# Patient Record
Sex: Female | Born: 1935 | Race: White | Hispanic: No | Marital: Married | Smoking: Never smoker
Health system: Southern US, Community
[De-identification: ages and names within clinical notes are randomized; demographics above are authoritative.]

## PROBLEM LIST (undated history)

## (undated) DIAGNOSIS — C801 Malignant (primary) neoplasm, unspecified: Secondary | ICD-10-CM

## (undated) DIAGNOSIS — I639 Cerebral infarction, unspecified: Secondary | ICD-10-CM

## (undated) DIAGNOSIS — C8590 Non-Hodgkin lymphoma, unspecified, unspecified site: Secondary | ICD-10-CM

## (undated) DIAGNOSIS — F419 Anxiety disorder, unspecified: Secondary | ICD-10-CM

## (undated) DIAGNOSIS — C569 Malignant neoplasm of unspecified ovary: Secondary | ICD-10-CM

## (undated) HISTORY — PX: APPENDECTOMY: SHX54

## (undated) HISTORY — PX: BREAST SURGERY: SHX581

## (undated) HISTORY — PX: TONSILLECTOMY: SUR1361

---

## 2009-08-25 ENCOUNTER — Encounter (INDEPENDENT_AMBULATORY_CARE_PROVIDER_SITE_OTHER): Payer: Self-pay | Admitting: Internal Medicine

## 2009-08-25 ENCOUNTER — Inpatient Hospital Stay (HOSPITAL_COMMUNITY): Admission: EM | Admit: 2009-08-25 | Discharge: 2009-08-26 | Payer: Self-pay | Admitting: Emergency Medicine

## 2009-08-25 ENCOUNTER — Ambulatory Visit: Payer: Self-pay | Admitting: Cardiology

## 2009-08-25 ENCOUNTER — Ambulatory Visit: Payer: Self-pay | Admitting: Vascular Surgery

## 2010-11-30 LAB — CBC
HCT: 38 % (ref 36.0–46.0)
Hemoglobin: 12.8 g/dL (ref 12.0–15.0)
MCHC: 33.1 g/dL (ref 30.0–36.0)
MCV: 95.5 fL (ref 78.0–100.0)
Platelets: 226 10*3/uL (ref 150–400)
RBC: 3.99 MIL/uL (ref 3.87–5.11)
WBC: 4.3 10*3/uL (ref 4.0–10.5)

## 2010-11-30 LAB — URINE CULTURE: Colony Count: 45000

## 2010-11-30 LAB — LIPID PANEL
Total CHOL/HDL Ratio: 3.6 RATIO
Triglycerides: 94 mg/dL (ref <150)
VLDL: 19 mg/dL (ref 0–40)

## 2010-11-30 LAB — HEMOCCULT GUIAC POC 1CARD (OFFICE): Fecal Occult Bld: NEGATIVE

## 2010-11-30 LAB — BASIC METABOLIC PANEL
BUN: 13 mg/dL (ref 6–23)
Calcium: 8.6 mg/dL (ref 8.4–10.5)
Calcium: 8.7 mg/dL (ref 8.4–10.5)
Chloride: 108 mEq/L (ref 96–112)
Creatinine, Ser: 0.71 mg/dL (ref 0.4–1.2)
GFR calc non Af Amer: >60 mL/min (ref >60)
GFR calc non Af Amer: >60 mL/min (ref >60)
Glucose, Bld: 105 mg/dL — ABNORMAL HIGH (ref 70–99)
Potassium: 3.6 mEq/L (ref 3.5–5.1)
Sodium: 136 mEq/L (ref 135–145)
Sodium: 138 mEq/L (ref 135–145)
Sodium: 139 mEq/L (ref 135–145)

## 2010-11-30 LAB — URINALYSIS, ROUTINE W REFLEX MICROSCOPIC
Bilirubin Urine: NEGATIVE
Ketones, ur: NEGATIVE mg/dL
Nitrite: NEGATIVE
Specific Gravity, Urine: 1.015 (ref 1.005–1.030)
Urobilinogen, UA: 0.2 mg/dL (ref 0.0–1.0)

## 2010-11-30 LAB — CARDIAC PANEL(CRET KIN+CKTOT+MB+TROPI)

## 2010-11-30 LAB — DIFFERENTIAL
Basophils Relative: 0 % (ref 0–1)
Eosinophils Absolute: 0.1 10*3/uL (ref 0.0–0.7)
Monocytes Relative: 7 % (ref 3–12)
Neutrophils Relative %: 75 % (ref 43–77)

## 2010-11-30 LAB — POCT CARDIAC MARKERS
CKMB, poc: <1 ng/mL — ABNORMAL LOW (ref 1.0–8.0)
Myoglobin, poc: 47.5 ng/mL (ref 12–200)

## 2010-11-30 LAB — CK TOTAL AND CKMB (NOT AT ARMC): CK, MB: 1.3 ng/mL (ref 0.3–4.0)

## 2010-11-30 LAB — URINE MICROSCOPIC-ADD ON

## 2014-10-18 ENCOUNTER — Ambulatory Visit
Admission: RE | Admit: 2014-10-18 | Discharge: 2014-10-18 | Disposition: A | Discharge status: Home / Self Care | Payer: Medicare Other | Source: Ambulatory Visit | Attending: Nurse Practitioner | Admitting: Nurse Practitioner

## 2014-10-18 ENCOUNTER — Other Ambulatory Visit: Payer: Self-pay | Admitting: Nurse Practitioner

## 2014-10-18 DIAGNOSIS — M542 Cervicalgia: Secondary | ICD-10-CM

## 2015-04-28 ENCOUNTER — Observation Stay (HOSPITAL_COMMUNITY)
Admission: EM | Admit: 2015-04-28 | Discharge: 2015-05-01 | Disposition: A | Discharge status: Skilled Nursing Facility | Payer: Medicare Other | Attending: Internal Medicine | Admitting: Internal Medicine

## 2015-04-28 ENCOUNTER — Encounter (HOSPITAL_COMMUNITY): Payer: Self-pay

## 2015-04-28 DIAGNOSIS — H81399 Other peripheral vertigo, unspecified ear: Secondary | ICD-10-CM | POA: Diagnosis not present

## 2015-04-28 DIAGNOSIS — Z23 Encounter for immunization: Secondary | ICD-10-CM | POA: Insufficient documentation

## 2015-04-28 DIAGNOSIS — R262 Difficulty in walking, not elsewhere classified: Secondary | ICD-10-CM | POA: Insufficient documentation

## 2015-04-28 DIAGNOSIS — R739 Hyperglycemia, unspecified: Secondary | ICD-10-CM | POA: Insufficient documentation

## 2015-04-28 DIAGNOSIS — E860 Dehydration: Secondary | ICD-10-CM | POA: Diagnosis not present

## 2015-04-28 DIAGNOSIS — E876 Hypokalemia: Secondary | ICD-10-CM

## 2015-04-28 DIAGNOSIS — Z7982 Long term (current) use of aspirin: Secondary | ICD-10-CM | POA: Diagnosis not present

## 2015-04-28 DIAGNOSIS — E785 Hyperlipidemia, unspecified: Secondary | ICD-10-CM | POA: Diagnosis not present

## 2015-04-28 DIAGNOSIS — Z8673 Personal history of transient ischemic attack (TIA), and cerebral infarction without residual deficits: Secondary | ICD-10-CM | POA: Insufficient documentation

## 2015-04-28 DIAGNOSIS — I10 Essential (primary) hypertension: Secondary | ICD-10-CM | POA: Diagnosis not present

## 2015-04-28 DIAGNOSIS — F419 Anxiety disorder, unspecified: Secondary | ICD-10-CM | POA: Insufficient documentation

## 2015-04-28 DIAGNOSIS — Z79899 Other long term (current) drug therapy: Secondary | ICD-10-CM | POA: Insufficient documentation

## 2015-04-28 DIAGNOSIS — R42 Dizziness and giddiness: Secondary | ICD-10-CM | POA: Diagnosis present

## 2015-04-28 DIAGNOSIS — H9190 Unspecified hearing loss, unspecified ear: Secondary | ICD-10-CM | POA: Diagnosis not present

## 2015-04-28 HISTORY — DX: Malignant (primary) neoplasm, unspecified: C80.1

## 2015-04-28 HISTORY — DX: Cerebral infarction, unspecified: I63.9

## 2015-04-28 HISTORY — DX: Anxiety disorder, unspecified: F41.9

## 2015-04-28 MED ORDER — SODIUM CHLORIDE 0.9 % IV SOLN
1000.0000 mL | INTRAVENOUS | Status: DC
  Administered 2015-04-29: 1000 mL via INTRAVENOUS

## 2015-04-28 MED ORDER — MECLIZINE HCL 25 MG PO TABS
25.0000 mg | ORAL_TABLET | Freq: Once | ORAL | Status: AC
  Administered 2015-04-29: 25 mg via ORAL
  Filled 2015-04-28: qty 1

## 2015-04-28 MED ORDER — ONDANSETRON HCL 4 MG/2ML IJ SOLN
4.0000 mg | Freq: Once | INTRAMUSCULAR | Status: AC
  Administered 2015-04-28: 4 mg via INTRAVENOUS
  Filled 2015-04-28: qty 2

## 2015-04-28 MED ORDER — SODIUM CHLORIDE 0.9 % IV SOLN
1000.0000 mL | Freq: Once | INTRAVENOUS | Status: AC
  Administered 2015-04-28: 1000 mL via INTRAVENOUS

## 2015-04-28 NOTE — ED Notes (Signed)
Per EMS: Pt complaining of epigastric pain and N/V. Hx of stroke, pt denies any chest pain. EMS gave 342 ASA and 4mg  Zofran. Pt very hard of hearing, states "she feels dizzy". BP 180/70, 56BPM.

## 2015-04-28 NOTE — ED Provider Notes (Signed)
CSN: [id number]     Arrival date & time 04/28/15  2337 History  This chart was scribed for Priscilla Fuel, MD by Randa Evens, ED Scribe. This patient was seen in room A01C/A01C and the patient's care was started at 11:41 PM.    Chief Complaint  Patient presents with  . Nausea  . Dizziness    Patient is a 81 y.o. female presenting with dizziness. The history is provided by the patient and the EMS personnel. No language interpreter was used.  Dizziness Associated symptoms: nausea    HPI Comments: Level 5 Caveat: Non verbal due to being hard of hearing.  Priscilla Leon is a 81 y.o. female brought in by ambulance, who presents to the Emergency Department complaining of dizziness and nausea onset PTA.    History reviewed. No pertinent past medical history. History reviewed. No pertinent past surgical history. History reviewed. No pertinent family history. Social History  Substance Use Topics  . Smoking status: Never Smoker   . Smokeless tobacco: None  . Alcohol Use: Yes   OB History    No data available     Review of Systems  Unable to perform ROS: Patient nonverbal  Gastrointestinal: Positive for nausea.  Neurological: Positive for dizziness.  All other systems reviewed and are negative.     Allergies  Review of patient's allergies indicates no known allergies.  Home Medications   Prior to Admission medications   Medication Sig Start Date End Date Taking? Authorizing Provider  ALPRAZolam (XANAX) 0.25 MG tablet Take 0.25 mg by mouth every 6 (six) hours as needed for anxiety.   Yes Historical Provider, MD  amLODipine (NORVASC) 5 MG tablet Take 5 mg by mouth daily.   Yes Historical Provider, MD  aspirin EC 81 MG tablet Take 81 mg by mouth daily.   Yes Historical Provider, MD  Cholecalciferol (VITAMIN D) 2000 UNITS tablet Take 2,000 Units by mouth daily.   Yes Historical Provider, MD  lovastatin (MEVACOR) 20 MG tablet Take 20 mg by mouth daily. 03/07/15  Yes Historical  Provider, MD  sertraline (ZOLOFT) 50 MG tablet Take 50 mg by mouth daily. 03/07/15  Yes Historical Provider, MD  vitamin B-12 (CYANOCOBALAMIN) 250 MCG tablet Take 250 mcg by mouth daily.   Yes Historical Provider, MD   BP 146/60 mmHg  Pulse 51  Resp 12  SpO2 98%   Physical Exam  Constitutional: She is oriented to person, place, and time. She appears well-developed and well-nourished. No distress.  HENT:  Head: Normocephalic and atraumatic.  Eyes: Conjunctivae and EOM are normal. Pupils are equal, round, and reactive to light.  No nystagmus   Neck: Normal range of motion. Neck supple. No JVD present.  Cardiovascular: Normal rate, regular rhythm and normal heart sounds.   No murmur heard. Pulmonary/Chest: Effort normal and breath sounds normal. She has no wheezes. She has no rales. She exhibits no tenderness.  Abdominal: Soft. She exhibits no distension and no mass. There is no tenderness.  Bowel sounds are decreased.  Musculoskeletal: Normal range of motion. She exhibits no edema.  Lymphadenopathy:    She has no cervical adenopathy.  Neurological: She is alert and oriented to person, place, and time. No cranial nerve deficit. She exhibits normal muscle tone. Coordination normal.  Symptoms reproduced by passive head movement.   Skin: Skin is warm and dry. No rash noted.  Psychiatric: She has a normal mood and affect. Her behavior is normal.  Nursing note and vitals reviewed.   ED Course  Procedures (including critical care time) DIAGNOSTIC STUDIES: Oxygen Saturation is 92% on RA, low by my interpretation.    COORDINATION OF CARE:    Labs Review Results for orders placed or performed during the hospital encounter of 43/15/40  Basic metabolic panel  Result Value Ref Range   Sodium 138 135 - 145 mmol/L   Potassium 3.0 (L) 3.5 - 5.1 mmol/L   Chloride 103 101 - 111 mmol/L   CO2 25 22 - 32 mmol/L   Glucose, Bld 156 (H) 65 - 99 mg/dL   BUN 16 6 - 20 mg/dL   Creatinine, Ser 0.75  0.44 - 1.00 mg/dL   Calcium 9.1 8.9 - 10.3 mg/dL   GFR calc non Af Amer >60 >60 mL/min   GFR calc Af Amer >60 >60 mL/min   Anion gap 10 5 - 15  CBC with Differential  Result Value Ref Range   WBC 7.0 4.0 - 10.5 K/uL   RBC 4.08 3.87 - 5.11 MIL/uL   Hemoglobin 13.0 12.0 - 15.0 g/dL   HCT 39.1 36.0 - 46.0 %   MCV 95.8 78.0 - 100.0 fL   MCH 31.9 26.0 - 34.0 pg   MCHC 33.2 30.0 - 36.0 g/dL   RDW 13.0 11.5 - 15.5 %   Platelets 186 150 - 400 K/uL   Neutrophils Relative % 63 43 - 77 %   Neutro Abs 4.4 1.7 - 7.7 K/uL   Lymphocytes Relative 26 12 - 46 %   Lymphs Abs 1.8 0.7 - 4.0 K/uL   Monocytes Relative 10 3 - 12 %   Monocytes Absolute 0.7 0.1 - 1.0 K/uL   Eosinophils Relative 1 0 - 5 %   Eosinophils Absolute 0.1 0.0 - 0.7 K/uL   Basophils Relative 0 0 - 1 %   Basophils Absolute 0.0 0.0 - 0.1 K/uL  Troponin I  Result Value Ref Range   Troponin I <0.03 <0.031 ng/mL   Imaging Review Mr Brain Wo Contrast  04/29/2015   CLINICAL DATA:  89 old female with history of stroke. Episodes of dizziness worse since yesterday. Initial encounter.  EXAM: MRI HEAD WITHOUT CONTRAST  TECHNIQUE: Multiplanar, multiecho pulse sequences of the brain and surrounding structures were obtained without intravenous contrast.  COMPARISON:  08/25/2009 head CT.  No comparison brain MR.  FINDINGS: No acute infarct.  Remote posterior left temporal - occipital lobe infarct with encephalomalacia and laminar necrosis/remote blood breakdown products.  Global atrophy without hydrocephalus.  No intracranial mass lesion noted on this unenhanced exam.  Major intracranial vascular structures are patent and ectatic.  Elongated asymmetric left globe.  Post right lens replacement.  Right C2-3 facet joint degenerative changes. Mild transverse ligament prominence. Cervical medullary junction unremarkable.  Pituitary and pineal region within the range normal limits.  Minimal polypoid opacification inferior left maxillary sinus.   Prominent choroid cysts incidentally noted in without change from prior CT.  IMPRESSION: No acute infarct.  Remote posterior left temporal - occipital lobe infarct.  Global atrophy without hydrocephalus.   Electronically Signed   By: Genia Del M.D.   On: 04/29/2015 07:22   I have personally reviewed and evaluated these images and lab results as part of my medical decision-making.   EKG Interpretation   Date/Time:  Monday April 28 2015 23:55:15 EDT Ventricular Rate:  56 PR Interval:  226 QRS Duration: 83 QT Interval:  528 QTC Calculation: 510 R Axis:   8 Text Interpretation:  Atrial-paced complexes Prolonged PR interval  Abnormal R-wave  progression, early transition Abnormal T, consider  ischemia, anterior leads Prolonged QT interval When compared with ECG of  08/25/2009, No significant change was found Confirmed by [facility]  MD, Shakaya Bhullar  ([id number]) on 04/28/2015 11:59:46 PM      MDM   Final diagnoses:  Peripheral vertigo, unspecified laterality  Hypokalemia    Dizziness and nausea worsened with head movement consistent with peripheral vertigo. She'll be given IV fluids, ondansetron, and oral meclizine and reassessed.    She felt better after above treatment, but when she sat up, dizziness and nausea recurred. She is hypokalemic, so was given IV and oral potassium and was given metoclopramide.  Following this, she again stated she felt better, but had recurrence of severe vertigo with sitting up. She is given a dose of lorazepam and sent for an MRI to rule out stroke. MRI shows no evidence of stroke and she again stated that she felt better. However, she still has recurrence of severe vertigo with as sitting up. She does not have adequate resources at home to manage this. I feel she will need to be admitted for short stay until her vertigo can be adequately controlled.   I personally performed the services described in this documentation, which was scribed in my presence. The recorded  information has been reviewed and is accurate.    Priscilla Fuel, MD 85/27/78 2423

## 2015-04-29 ENCOUNTER — Emergency Department (HOSPITAL_COMMUNITY): Payer: Medicare Other

## 2015-04-29 ENCOUNTER — Encounter (HOSPITAL_COMMUNITY): Payer: Self-pay | Admitting: Emergency Medicine

## 2015-04-29 DIAGNOSIS — F419 Anxiety disorder, unspecified: Secondary | ICD-10-CM | POA: Insufficient documentation

## 2015-04-29 DIAGNOSIS — E876 Hypokalemia: Secondary | ICD-10-CM | POA: Diagnosis present

## 2015-04-29 DIAGNOSIS — R42 Dizziness and giddiness: Secondary | ICD-10-CM

## 2015-04-29 DIAGNOSIS — E860 Dehydration: Secondary | ICD-10-CM

## 2015-04-29 DIAGNOSIS — H9190 Unspecified hearing loss, unspecified ear: Secondary | ICD-10-CM | POA: Diagnosis not present

## 2015-04-29 DIAGNOSIS — I10 Essential (primary) hypertension: Secondary | ICD-10-CM | POA: Diagnosis not present

## 2015-04-29 DIAGNOSIS — H81399 Other peripheral vertigo, unspecified ear: Secondary | ICD-10-CM | POA: Diagnosis present

## 2015-04-29 DIAGNOSIS — R739 Hyperglycemia, unspecified: Secondary | ICD-10-CM | POA: Diagnosis present

## 2015-04-29 LAB — CBC WITH DIFFERENTIAL/PLATELET
BASOS ABS: 0 10*3/uL (ref 0.0–0.1)
BASOS PCT: 0 % (ref 0–1)
Eosinophils Absolute: 0.1 10*3/uL (ref 0.0–0.7)
Eosinophils Relative: 1 % (ref 0–5)
HEMATOCRIT: 39.1 % (ref 36.0–46.0)
HEMOGLOBIN: 13 g/dL (ref 12.0–15.0)
LYMPHS PCT: 26 % (ref 12–46)
Lymphs Abs: 1.8 10*3/uL (ref 0.7–4.0)
MCH: 31.9 pg (ref 26.0–34.0)
MCHC: 33.2 g/dL (ref 30.0–36.0)
MCV: 95.8 fL (ref 78.0–100.0)
MONO ABS: 0.7 10*3/uL (ref 0.1–1.0)
Monocytes Relative: 10 % (ref 3–12)
NEUTROS ABS: 4.4 10*3/uL (ref 1.7–7.7)
NEUTROS PCT: 63 % (ref 43–77)
Platelets: 186 10*3/uL (ref 150–400)
RBC: 4.08 MIL/uL (ref 3.87–5.11)
RDW: 13 % (ref 11.5–15.5)
WBC: 7 10*3/uL (ref 4.0–10.5)

## 2015-04-29 LAB — BASIC METABOLIC PANEL
ANION GAP: 10 (ref 5–15)
BUN: 16 mg/dL (ref 6–20)
CALCIUM: 9.1 mg/dL (ref 8.9–10.3)
CO2: 25 mmol/L (ref 22–32)
Chloride: 103 mmol/L (ref 101–111)
Creatinine, Ser: 0.75 mg/dL (ref 0.44–1.00)
GLUCOSE: 156 mg/dL — AB (ref 65–99)
POTASSIUM: 3 mmol/L — AB (ref 3.5–5.1)
Sodium: 138 mmol/L (ref 135–145)

## 2015-04-29 LAB — URINE MICROSCOPIC-ADD ON

## 2015-04-29 LAB — TROPONIN I: Troponin I: <0.03 ng/mL (ref <0.031)

## 2015-04-29 LAB — URINALYSIS, ROUTINE W REFLEX MICROSCOPIC
Bilirubin Urine: NEGATIVE
Glucose, UA: NEGATIVE mg/dL
Ketones, ur: NEGATIVE mg/dL
LEUKOCYTES UA: NEGATIVE
NITRITE: NEGATIVE
PROTEIN: NEGATIVE mg/dL
SPECIFIC GRAVITY, URINE: 1.006 (ref 1.005–1.030)
UROBILINOGEN UA: 0.2 mg/dL (ref 0.0–1.0)
pH: 7 (ref 5.0–8.0)

## 2015-04-29 LAB — MRSA PCR SCREENING: MRSA BY PCR: NEGATIVE

## 2015-04-29 LAB — MAGNESIUM: MAGNESIUM: 1.7 mg/dL (ref 1.7–2.4)

## 2015-04-29 LAB — TSH: TSH: 2.517 u[IU]/mL (ref 0.350–4.500)

## 2015-04-29 LAB — VITAMIN B12: Vitamin B-12: 1332 pg/mL — ABNORMAL HIGH (ref 180–914)

## 2015-04-29 MED ORDER — POTASSIUM CHLORIDE 10 MEQ/100ML IV SOLN
10.0000 meq | Freq: Once | INTRAVENOUS | Status: AC
  Administered 2015-04-29: 10 meq via INTRAVENOUS
  Filled 2015-04-29: qty 100

## 2015-04-29 MED ORDER — SENNOSIDES-DOCUSATE SODIUM 8.6-50 MG PO TABS: 1.0000 | ORAL_TABLET | Freq: Every evening | ORAL | Status: DC | PRN

## 2015-04-29 MED ORDER — LORAZEPAM 2 MG/ML IJ SOLN
0.5000 mg | Freq: Once | INTRAMUSCULAR | Status: AC
  Administered 2015-04-29: 0.5 mg via INTRAVENOUS
  Filled 2015-04-29: qty 1

## 2015-04-29 MED ORDER — ACETAMINOPHEN 325 MG PO TABS: 650.0000 mg | ORAL_TABLET | Freq: Four times a day (QID) | ORAL | Status: DC | PRN

## 2015-04-29 MED ORDER — VITAMIN D 50 MCG (2000 UT) PO TABS: 2000.0000 [IU] | ORAL_TABLET | Freq: Every day | ORAL | Status: DC

## 2015-04-29 MED ORDER — SERTRALINE HCL 50 MG PO TABS
50.0000 mg | ORAL_TABLET | Freq: Every day | ORAL | Status: DC
  Administered 2015-04-29 – 2015-05-01 (×3): 50 mg via ORAL
  Filled 2015-04-29 (×3): qty 1

## 2015-04-29 MED ORDER — ALUM & MAG HYDROXIDE-SIMETH 200-200-20 MG/5ML PO SUSP: 30.0000 mL | Freq: Four times a day (QID) | ORAL | Status: DC | PRN

## 2015-04-29 MED ORDER — SODIUM CHLORIDE 0.9 % IV SOLN
INTRAVENOUS | Status: AC
  Administered 2015-04-29: 11:00:00 via INTRAVENOUS

## 2015-04-29 MED ORDER — ENOXAPARIN SODIUM 40 MG/0.4ML ~~LOC~~ SOLN
40.0000 mg | SUBCUTANEOUS | Status: DC
  Administered 2015-04-29: 40 mg via SUBCUTANEOUS
  Filled 2015-04-29: qty 0.4

## 2015-04-29 MED ORDER — POTASSIUM CHLORIDE CRYS ER 20 MEQ PO TBCR
20.0000 meq | EXTENDED_RELEASE_TABLET | Freq: Once | ORAL | Status: AC
  Administered 2015-04-29: 20 meq via ORAL
  Filled 2015-04-29: qty 1

## 2015-04-29 MED ORDER — VITAMIN B-12 250 MCG PO TABS: 250.0000 ug | ORAL_TABLET | Freq: Every day | ORAL | Status: DC

## 2015-04-29 MED ORDER — POTASSIUM CHLORIDE CRYS ER 20 MEQ PO TBCR
40.0000 meq | EXTENDED_RELEASE_TABLET | Freq: Once | ORAL | Status: AC
  Administered 2015-04-29: 40 meq via ORAL
  Filled 2015-04-29: qty 2

## 2015-04-29 MED ORDER — ACETAMINOPHEN 650 MG RE SUPP: 650.0000 mg | Freq: Four times a day (QID) | RECTAL | Status: DC | PRN

## 2015-04-29 MED ORDER — VITAMIN D 1000 UNITS PO TABS
2000.0000 [IU] | ORAL_TABLET | Freq: Every day | ORAL | Status: DC
  Administered 2015-04-29 – 2015-05-01 (×3): 2000 [IU] via ORAL
  Filled 2015-04-29 (×3): qty 2

## 2015-04-29 MED ORDER — CYANOCOBALAMIN 250 MCG PO TABS
250.0000 ug | ORAL_TABLET | Freq: Every day | ORAL | Status: DC
  Administered 2015-04-29 – 2015-05-01 (×3): 250 ug via ORAL
  Filled 2015-04-29 (×3): qty 1

## 2015-04-29 MED ORDER — AMLODIPINE BESYLATE 5 MG PO TABS
5.0000 mg | ORAL_TABLET | Freq: Every day | ORAL | Status: DC
  Administered 2015-04-29 – 2015-05-01 (×3): 5 mg via ORAL
  Filled 2015-04-29 (×3): qty 1

## 2015-04-29 MED ORDER — INFLUENZA VAC SPLIT QUAD 0.5 ML IM SUSY
0.5000 mL | PREFILLED_SYRINGE | INTRAMUSCULAR | Status: AC
  Administered 2015-05-01: 0.5 mL via INTRAMUSCULAR
  Filled 2015-04-29: qty 0.5

## 2015-04-29 MED ORDER — PRAVASTATIN SODIUM 20 MG PO TABS
20.0000 mg | ORAL_TABLET | Freq: Every day | ORAL | Status: DC
  Administered 2015-04-29 – 2015-04-30 (×2): 20 mg via ORAL
  Filled 2015-04-29 (×2): qty 1

## 2015-04-29 MED ORDER — MECLIZINE HCL 12.5 MG PO TABS
12.5000 mg | ORAL_TABLET | Freq: Three times a day (TID) | ORAL | Status: DC
  Administered 2015-04-29 – 2015-05-01 (×7): 12.5 mg via ORAL
  Filled 2015-04-29 (×10): qty 1

## 2015-04-29 MED ORDER — METOCLOPRAMIDE HCL 5 MG/ML IJ SOLN
10.0000 mg | Freq: Once | INTRAMUSCULAR | Status: AC
  Administered 2015-04-29: 10 mg via INTRAVENOUS
  Filled 2015-04-29: qty 2

## 2015-04-29 MED ORDER — ASPIRIN EC 81 MG PO TBEC
81.0000 mg | DELAYED_RELEASE_TABLET | Freq: Every day | ORAL | Status: DC
Start: 2015-04-29 — End: 2015-05-01
  Administered 2015-04-29 – 2015-05-01 (×3): 81 mg via ORAL
  Filled 2015-04-29 (×4): qty 1

## 2015-04-29 NOTE — ED Notes (Signed)
Patient just used bedpan approximately 30 minutes ago.

## 2015-04-29 NOTE — Care Management Note (Signed)
Case Management Note  Patient Details  Name: Sian Rockers MRN: [id number] Date of Birth: 06-22-1924  Subjective/Objective:     Patient lives in [facility] facility, [place], she says her family lives there as well. She has [employer] and she has a pcp.  She has transportation at [employer] and has no problem getting medications.  Await pt eval.  NCM will cont to follow for dc needs.               Action/Plan:   Expected Discharge Date:                  Expected Discharge Plan:  [place]  In-House Referral:     Discharge planning Services  CM Consult  Post Acute Care Choice:    Choice offered to:     DME Arranged:    DME Agency:     HH Arranged:    [place] Agency:     Status of Service:  In process, will continue to follow  Medicare Important Message Given:    Date Medicare IM Given:    Medicare IM give by:    Date Additional Medicare IM Given:    Additional Medicare Important Message give by:     If discussed at [place] of Stay Meetings, dates discussed:    Additional Comments:  Zenon Mayo, RN 04/29/2015, 2:18 PM

## 2015-04-29 NOTE — ED Notes (Signed)
Called floor to give report, they advised would have to take my number and call me back in 15 minutes.

## 2015-04-29 NOTE — Progress Notes (Signed)
NURSING PROGRESS NOTE  Priscilla Leon [id number] Admission Data: 04/29/2015 9:39 AM Attending Provider: Barton Dubois, MD [id number], Herschell Dimes, MD Code Status: Full  Allergies:  Review of patient's allergies indicates no known allergies. Past Medical History:   has a past medical history of Stroke. Past Surgical History:   has no past surgical history on file. Social History:   reports that she has never smoked. She does not have any smokeless tobacco history on file. She reports that she drinks alcohol.  Priscilla Leon is a 81 y.o. female patient admitted from ED:   Last Documented Vital Signs: Blood pressure 142/53, pulse 60, resp. rate 18, SpO2 96 %.  Cardiac Monitoring:  None ordered.  IV Fluids:  IV in place, occlusive dsg intact without redness, IV cath antecubital left, condition patent and no redness normal saline.   Skin: WDL  Patient/Family orientated to room. Information packet given to patient/family. Admission inpatient armband information verified with patient/family to include name and date of birth and placed on patient arm. Side rails up x 2, fall assessment and education completed with patient/family. Patient/family able to verbalize understanding of risk associated with falls and verbalized understanding to call for assistance before getting out of bed. Call light within reach. Patient/family able to voice and demonstrate understanding of unit orientation instructions.    Will continue to evaluate and treat per MD orders.   Hendricks Limes RN, BS, BSN

## 2015-04-29 NOTE — ED Notes (Signed)
Patient transported to MRI with family

## 2015-04-29 NOTE — ED Notes (Signed)
Patient had bowel movement, unable to get urine.

## 2015-04-29 NOTE — Evaluation (Signed)
Physical Therapy Evaluation Patient Details Name: Priscilla Leon MRN: [id number] DOB: [birth year] Today's Date: 04/29/2015   History of Present Illness  81 y.o. female admitted for peripheral vertigo, and hypokalemia.  Clinical Impression  Pt admitted with the above diagnosis. Pt currently with functional limitations due to the deficits listed below (see PT Problem List). Positive for right horizontal cupulolithiasis and right posterior canalithiasis when assessed with horizontal roll test and Dix-Hallpike test. Treated with Modified CRT and Epley's Maneuver. These procedures typically need to be performed 2-3 times in cases with high intensity symptoms, which Priscilla Leon presents with today. Will follow up tomorrow for further assessment and progression.    Follow Up Recommendations Home health PT;Supervision/Assistance - 24 hour (Vestibular rehab)    Equipment Recommendations  Vestibular Assessment    04/29/15 1721  Vestibular Assessment  General Observation No apparent distress while supine in bed. Very apprehensive upon sitting up with significant loss of balance and need for external support to remain upright when symptoms elicited.  Symptom Behavior  Type of Dizziness Spinning  Duration of Dizziness Delayed onset up to 1-2 minutes after sitting. High intensity symptoms last approx 2 minutes with residual symptoms of spinning occuring >2 minutes  Aggravating Factors Supine to sit  Relieving Factors Lying supine  Occulomotor Exam  Occulomotor Alignment Normal  Spontaneous Right beating nystagmus  Smooth Pursuits Saccades  Vestibulo-Occular Reflex  Comment Not assessed due to intensity of symptoms at time of evaluation  Auditory  Comments Denies any changes in hearing, HOH at baseline  Positional Testing  Dix-Hallpike Dix-Hallpike Right;Dix-Hallpike Left  Horizontal Canal Testing Horizontal Canal Right;Horizontal Canal Left  Dix-Hallpike Right  Dix-Hallpike Right Duration  1.5 min  Dix-Hallpike Right Symptoms Upbeat, right rotatory nystagmus  Dix-Hallpike Left  Dix-Hallpike Left Duration 1.5 minutes  Dix-Hallpike Left Symptoms Upbeat, right rotatory nystagmus  Horizontal Canal Right  Horizontal Canal Right Duration 2 minutes  Horizontal Canal Right Symptoms Geotrophic;Nystagmus (less intense with head turned right)  Horizontal Canal Left  Horizontal Canal Left Duration 2 minutes  Horizontal Canal Left Symptoms Ageotrophic;Nystagmus (More intense with head turned left)  Positional Sensitivities  Sit to Supine 0  Supine to Right Side 2  Supine to Sitting 4  Right Hallpike 0  Up from Right Hallpike 4  Up from Left Hallpike 4  Rolling Right 2  Rolling Left 0  Positional Sensitivities Comments Minimal symptoms reported with rt head turn in 30 degrees of flexion, no spinning reported with Lt head turn in 30 degrees of cervical flexion    None recommended by PT      Recommendations for Other Services       Precautions / Restrictions Precautions Precautions: Fall Restrictions Weight Bearing Restrictions: No      Mobility  Bed Mobility Overal bed mobility: Needs Assistance Bed Mobility: Rolling;Sidelying to Sit;Sit to Sidelying Rolling: Modified independent (Device/Increase time) Sidelying to sit: Modified independent (Device/Increase time)     Sit to sidelying: Modified independent (Device/Increase time) General bed mobility comments: Requires extra time.  Transfers                 General transfer comment: unable to tolerate at this time due to dizziness.  Ambulation/Gait                Stairs            Wheelchair Mobility    Modified Rankin (Stroke Patients Only)       Balance Overall balance assessment: Needs assistance Sitting-balance support: No  upper extremity supported Sitting balance-Leahy Scale: Fair Sitting balance - Comments: Tolerates sitting EOB approx 1 minute unsupported before symptoms  cause pt to lose balance and reach for bed for stability. This was assessed several times.                                     Pertinent Vitals/Pain Pain Assessment: No/denies pain    Home Living Family/patient expects to be discharged to:: Private residence Living Arrangements: Spouse/significant other Available Help at Discharge: Family;Available 24 hours/day Type of Home: Independent living facility       Home Layout: One level Home Equipment: [place] - 2 wheels;Cane - single point Additional Comments: Daughter reports 24/7 supervision can be arranged for a short period of time.    Prior Function Level of Independence: Independent with assistive device(s)         Comments: Uses cane for mobility. Assists husband with ADLs     Hand Dominance        Extremity/Trunk Assessment   Upper Extremity Assessment: Defer to OT evaluation           Lower Extremity Assessment: Overall WFL for tasks assessed         Communication   Communication: No difficulties  Cognition Arousal/Alertness: Awake/alert Behavior During Therapy: WFL for tasks assessed/performed Overall Cognitive Status: History of cognitive impairments - at baseline                      General Comments General comments (skin integrity, edema, etc.): See vestibular assessment attached to note    Exercises        Assessment/Plan    PT Assessment Patient needs continued PT services  PT Diagnosis Difficulty walking;Other (comment) (Dizziness)   PT Problem List Decreased activity tolerance;Decreased balance;Decreased mobility  PT Treatment Interventions DME instruction;Gait training;Functional mobility training;Therapeutic exercise;Therapeutic activities;Balance training;Patient/family education   PT Goals (Current goals can be found in the Care Plan section) Acute Rehab PT Goals Patient Stated Goal: Feel better PT Goal Formulation: With patient Time For Goal Achievement:  05/13/15 Potential to Achieve Goals: Good    Frequency Min 3X/week   Barriers to discharge Decreased caregiver support Can arrange 24/7 assist if needed, for a short period of time    Co-evaluation               End of Session   Activity Tolerance: Other (comment) (Limited by dizziness) Patient left: in bed;with call bell/phone within reach;with bed alarm set;with family/visitor present Nurse Communication: Mobility status    Functional Assessment Tool Used: Clinical Observation Functional Limitation: Mobility: Walking and moving around Mobility: Walking and Moving Around Current Status (Y1017): At least 60 percent but less than 80 percent impaired, limited or restricted Mobility: Walking and Moving Around Goal Status [phone]): At least 1 percent but less than 20 percent impaired, limited or restricted    Time: 1559-1650 PT Time Calculation (min) (ACUTE ONLY): 51 min   Charges:   PT Evaluation $Initial PT Evaluation Tier I: 1 Procedure PT Treatments $Therapeutic Activity: 8-22 mins $Canalith Rep Proc: 8-22 mins   PT G Codes:   PT G-Codes **NOT FOR INPATIENT CLASS** Functional Assessment Tool Used: Clinical Observation Functional Limitation: Mobility: Walking and moving around Mobility: Walking and Moving Around Current Status (E5277): At least 60 percent but less than 80 percent impaired, limited or restricted Mobility: Walking and Moving Around Goal Status [phone]): At  least 1 percent but less than 20 percent impaired, limited or restricted       Priscilla Leon 04/29/2015, 5:31 PM Camille Bal [place], [place]

## 2015-04-29 NOTE — H&P (Signed)
[facility] History and Physical                                                                                    Priscilla Leon, is a 81 y.o. female  MRN: [id number]   DOB - Feb 16, 1924  Admit Date - 04/28/2015  Outpatient Primary MD for the patient is Shamleffer, Herschell Dimes, MD  Referring Physician:    Chief Complaint:   Chief Complaint  Patient presents with  . Nausea  . Dizziness     HPI  Priscilla Leon  is a 81 y.o. female, with hypertension, history of anemia and previous stroke. She moved to an independent living facility in [place] from [place] in November 2015. Per her daughter, the patient was feeling well over the weekend but yesterday acutely developed symptoms of dizziness. The room spins around her. She is shaking and complains of headache. She is unable to stand. The patient reports that for the past week she's been feeling badly, she hasn't noticed any particular pain, but she describes decreased energy. Both the patient and her daughter indicate a high level of stress at home. Both patient and her husband are developing dementia. Reportedly the patient attends to all of her husband's ADLs and recently this has become too much for her.  Yesterday the patient took one alprazolam 0.25 that are prescribed PRN.  The daughter mentions that previously when her mother took Xanax on a more regular basis she had symptoms of dizziness and hallucinations.  Further she mentions that the patient's SSRI was changed to Zoloft at some point recently.  The patient and her husband are very active.  [facility] [employer] [facility]  In the ER, MR Brain is negative for acute stroke.  Labs indicate hypokalemia and mild hyperglycemia (150).   Review of Systems   In addition to the HPI above,  No Fever-chills, + Headache No problems swallowing food or Liquids, No Chest pain, Cough or Shortness of Breath, No Abdominal pain, No Nausea or Vomiting, Bowel  movements are regular, No Blood in stool or Urine, No dysuria, No new skin rashes or bruises, No new joints pains-aches,  No new weakness, tingling, numbness in any extremity, No recent weight gain or loss, A full 10 point Review of Systems was done, except as stated above, all other Review of Systems were negative.  Past Medical History  Past Medical History  Diagnosis Date  . Stroke     History reviewed. No pertinent past surgical history.    Social History Social History  Substance Use Topics  . Smoking status: Never Smoker   . Smokeless tobacco: Not on file  . Alcohol Use: Yes   lives in independent living with her husband. Independent with ADLs. Reportedly very [facility] and [facility] [employer] [facility]  Family History Her mother died at age 40 with cardiac issues, her 3 brothers also passed in their 53s with cardiac issues  Prior to Admission medications   Medication Sig Start Date End Date Taking? Authorizing Provider  ALPRAZolam (XANAX) 0.25 MG tablet Take 0.25 mg by mouth every 6 (six) hours as needed for anxiety.  Yes Historical Provider, MD  amLODipine (NORVASC) 5 MG tablet Take 5 mg by mouth daily.   Yes Historical Provider, MD  aspirin EC 81 MG tablet Take 81 mg by mouth daily.   Yes Historical Provider, MD  Cholecalciferol (VITAMIN D) 2000 UNITS tablet Take 2,000 Units by mouth daily.   Yes Historical Provider, MD  lovastatin (MEVACOR) 20 MG tablet Take 20 mg by mouth daily. 03/07/15  Yes Historical Provider, MD  sertraline (ZOLOFT) 50 MG tablet Take 50 mg by mouth daily. 03/07/15  Yes Historical Provider, MD  vitamin B-12 (CYANOCOBALAMIN) 250 MCG tablet Take 250 mcg by mouth daily.   Yes Historical Provider, MD    No Known Allergies  Physical Exam  Vitals  Blood pressure 142/53, pulse 60, resp. rate 18, SpO2 96 %.   General: Thin, frail, pleasant female lying in bed in NAD, heart of hearing, blind in left eye  Psych:  Normal affect and insight, Not Suicidal  or Homicidal, Awake Alert, Oriented X 3.  Neuro:   Positive horizontal nystagmus, 5/5 strength symmetric, sensitivity is symmetric, Patient's body wheels to the left when she attempts to sit up slowly.  ENT:  Blind in left eye.  Ears and Eyes appear Normal, Conjunctivae clear, PER. Dry oral mucosa, no erythema or exudates  Neck:  Supple, No lymphadenopathy appreciated  Respiratory:  Apneic at  times with respiratory rates between 9 and 11. Symmetrical chest wall movement, Good air movement bilaterally, CTAB.  Cardiac:  Slightly bradycardic, No Murmurs, no LE edema noted, no JVD.    Abdomen:  Thin, Positive bowel sounds, Soft, Non tender, Non distended,  No masses appreciated  Skin:  No Cyanosis, decreased Skin Turgor, No Skin Rash or Bruise.  Extremities:  Able to move all 4. 5/5 strength in each,  no effusions.  Data Review  CBC  Recent Labs Lab 04/29/15 0014  WBC 7.0  HGB 13.0  HCT 39.1  PLT 186  MCV 95.8  MCH 31.9  MCHC 33.2  RDW 13.0  LYMPHSABS 1.8  MONOABS 0.7  EOSABS 0.1  BASOSABS 0.0    Chemistries   Recent Labs Lab 04/29/15 0014  NA 138  K 3.0*  CL 103  CO2 25  GLUCOSE 156*  BUN 16  CREATININE 0.75  CALCIUM 9.1    Cardiac Enzymes  Recent Labs Lab 04/29/15 0014  TROPONINI <0.03     Urinalysis: Pending  Imaging results:   Mr Brain Wo Contrast  04/29/2015   CLINICAL DATA:  46 old female with history of stroke. Episodes of dizziness worse since yesterday. Initial encounter.  EXAM: MRI HEAD WITHOUT CONTRAST  TECHNIQUE: Multiplanar, multiecho pulse sequences of the brain and surrounding structures were obtained without intravenous contrast.  COMPARISON:  08/25/2009 head CT.  No comparison brain MR.  FINDINGS: No acute infarct.  Remote posterior left temporal - occipital lobe infarct with encephalomalacia and laminar necrosis/remote blood breakdown products.  Global atrophy without hydrocephalus.  No intracranial mass lesion noted on this  unenhanced exam.  Major intracranial vascular structures are patent and ectatic.  Elongated asymmetric left globe.  Post right lens replacement.  Right C2-3 facet joint degenerative changes. Mild transverse ligament prominence. Cervical medullary junction unremarkable.  Pituitary and pineal region within the range normal limits.  Minimal polypoid opacification inferior left maxillary sinus.  Prominent choroid cysts incidentally noted in without change from prior CT.  IMPRESSION: No acute infarct.  Remote posterior left temporal - occipital lobe infarct.  Global atrophy without hydrocephalus.  Electronically Signed   By: Genia Del M.D.   On: 04/29/2015 07:22    My personal review of EKG: Repeating EKG. Prolonged QT. No significant change from 2010 EKG. Pacer spikes due to artifact.   Assessment & Plan  Principal Problem:   Peripheral vertigo Active Problems:   Hypokalemia   HTN (hypertension)   Hyperglycemia   Dehydration   Peripheral vertigo MRI negative for acute stroke.  Patient has had a previous stroke with no residual deficits. Possibly multifactorial-consider medications (alprazolam, Zoloft), dehydration, urinary analysis pending Admit to med surg.  Scheduled TID meclizine.  Vestibular physical therapy evaluation. Check TSH, Serum B12.  Hypokalemia Magnesium level pending. Received 50 mEq of potassium in the ER. Will continue to replete in gentle IV fluids.  Hypertension Continue amlodipine as she takes at home. Will check orthostatics and make further recommendations after reviewing these results.  Hyperglycemia Check hemoglobin A1c. No previous history of diabetes mellitus  Dehydration Based on clinical exam. We'll give gentle IV fluids for a limited period.    Consultants Called:  none  Family Communication:   Her bedside  Code Status:  full  Condition:  Stable, but guarded.  Potential Disposition:   Time spent in minutes : Lynn,  PA-C on  04/29/2015 at 8:58 AM Between 7am to 7pm - Pager - [phone] After 7pm go to [email or web address] - password [email or web address] And look for the night coverage person covering me after hours  [facility]

## 2015-04-29 NOTE — Progress Notes (Signed)
MD [facility] paged results of new EKG taken at 0958.

## 2015-04-29 NOTE — ED Notes (Signed)
Patient didn't tolerate ambulation.  Patient states she was "too dizzy to get up".

## 2015-04-29 NOTE — ED Notes (Signed)
Hospitalist at bedside to speak with patient and family.

## 2015-04-29 NOTE — ED Notes (Signed)
Hospitalists still at bedside.

## 2015-04-30 DIAGNOSIS — H81399 Other peripheral vertigo, unspecified ear: Secondary | ICD-10-CM | POA: Diagnosis not present

## 2015-04-30 DIAGNOSIS — I10 Essential (primary) hypertension: Secondary | ICD-10-CM

## 2015-04-30 DIAGNOSIS — E876 Hypokalemia: Secondary | ICD-10-CM | POA: Diagnosis not present

## 2015-04-30 DIAGNOSIS — R739 Hyperglycemia, unspecified: Secondary | ICD-10-CM | POA: Diagnosis not present

## 2015-04-30 LAB — HEMOGLOBIN A1C
Hgb A1c MFr Bld: 5.8 % — ABNORMAL HIGH (ref 4.8–5.6)
MEAN PLASMA GLUCOSE: 120 mg/dL

## 2015-04-30 LAB — BASIC METABOLIC PANEL
ANION GAP: 7 (ref 5–15)
BUN: 7 mg/dL (ref 6–20)
CHLORIDE: 103 mmol/L (ref 101–111)
CO2: 28 mmol/L (ref 22–32)
Calcium: 8.5 mg/dL — ABNORMAL LOW (ref 8.9–10.3)
Creatinine, Ser: 0.55 mg/dL (ref 0.44–1.00)
Glucose, Bld: 92 mg/dL (ref 65–99)
POTASSIUM: 3.3 mmol/L — AB (ref 3.5–5.1)
SODIUM: 138 mmol/L (ref 135–145)

## 2015-04-30 LAB — URINE CULTURE

## 2015-04-30 MED ORDER — ENOXAPARIN SODIUM 30 MG/0.3ML ~~LOC~~ SOLN
30.0000 mg | SUBCUTANEOUS | Status: DC
  Administered 2015-04-30 – 2015-05-01 (×2): 30 mg via SUBCUTANEOUS
  Filled 2015-04-30 (×2): qty 0.3

## 2015-04-30 MED ORDER — MAGNESIUM SULFATE 2 GM/50ML IV SOLN
2.0000 g | Freq: Once | INTRAVENOUS | Status: AC
  Administered 2015-04-30: 2 g via INTRAVENOUS
  Filled 2015-04-30: qty 50

## 2015-04-30 MED ORDER — POTASSIUM CHLORIDE CRYS ER 20 MEQ PO TBCR
40.0000 meq | EXTENDED_RELEASE_TABLET | Freq: Once | ORAL | Status: AC
  Administered 2015-04-30: 40 meq via ORAL
  Filled 2015-04-30: qty 2

## 2015-04-30 MED ORDER — ALPRAZOLAM 0.25 MG PO TABS
0.2500 mg | ORAL_TABLET | Freq: Three times a day (TID) | ORAL | Status: DC | PRN
  Administered 2015-04-30 – 2015-05-01 (×2): 0.25 mg via ORAL
  Filled 2015-04-30 (×3): qty 1

## 2015-04-30 NOTE — Progress Notes (Signed)
Physical Therapy Treatment Patient Details Name: Priscilla Leon MRN: [id number] DOB: August 10, 1924 Today's Date: 04/30/2015    History of Present Illness 81 y.o. female admitted for peripheral vertigo, and hypokalemia.    PT Comments    Further vestibular assessment completed today indicating a peripheral left hypofunction (unknown cause- as pt doesn't respond yes to any of the history questions).  Son reports that she has a "vertigo episode" ~3 years ago that took a few weeks to go away and she was in the hospital during that episode as well (with no know source or diagnosis that he knows of).  She continues to be significantly unsteady on her feet and requires two person assist to just transfer to the [facility] safely.  She, at this point, is not safe to return home to an independent living apartment and will need post acute rehab.  SNF recommended at this time.    Follow Up Recommendations  SNF     Equipment Recommendations  Wheelchair (measurements PT);Wheelchair cushion (measurements PT)    Recommendations for Other Services   NA     Precautions / Restrictions Precautions Precautions: Fall Precaution Comments: very high fall risk     Mobility  Bed Mobility Overal bed mobility: Needs Assistance Bed Mobility: Rolling;Sidelying to Sit;Supine to Sit;Sit to Supine Rolling: Supervision Sidelying to sit: Min assist Supine to sit: Min assist Sit to supine: Min assist   General bed mobility comments: Min assist to support trunk when going to sitting as this is symptomatic for patient, assist level immediately increases when sitting EOB.   Transfers Overall transfer level: Needs assistance Equipment used: 2 person hand held assist Transfers: Sit to/from [facility] Sit to Stand: +2 physical assistance;Mod assist Stand pivot transfers: +2 physical assistance;Mod assist       General transfer comment: Two person mod assist to stand and get onto and off of bedside  commode.  Encouraged pt to move slowly and use targets to try to steady her "shakes".  Pt's automatic inclination is to close her eyes when she feels "the shakes" and I instead encouraged her to focus on my nose to stabilize her gaze and boughts of vertigo resolved more quickly with this strategy.   Ambulation/Gait             General Gait Details: unable to attempt ambulation.        Balance Overall balance assessment: Needs assistance Sitting-balance support: Feet supported;Bilateral upper extremity supported Sitting balance-Leahy Scale: Poor Sitting balance - Comments: min up to max assist seated EOB with the most assist needed initially upon sitting and with any attempts to move and scoot towards the EOB.  With each movement, I had her focus on my nose and keep her eyes open until her imbalance resolved and we could move again.  This is very segmented and slow movement.    Standing balance support: Bilateral upper extremity supported Standing balance-Leahy Scale: Poor Standing balance comment: Two person up to mod assist in standing for balance and support.                     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  Symptom Behavior  Type of Dizziness Imbalance ("the shakes")  Frequency of Dizziness with movement (supine to sit, sit to supine, sit to stand, scooting)  Duration of Dizziness <1 min when using target compensation and eyes open  Aggravating Factors  Turning body quickly;Supine to sit;Sit to stand;Rolling to right  Relieving Factors Lying supine;Slow movements  Occulomotor Exam  Occulomotor Alignment Normal  Spontaneous Absent  Gaze-induced Right beating nystagmus with R gaze  Smooth Pursuits Comment (right beating nystagmus throughout smooth pursuits)  Vestibulo-Occular Reflex  VOR 1 Head Only (x 1 viewing) intact, continued right beating nystagmus throughout testing  Auditory  Comments HOH, hearing aids bil, no changes, no tinnitus, no fullness  Positional Testing  Dix-Hallpike Dix-Hallpike Right;Dix-Hallpike Left  Sidelying Test Sidelying Right;Sidelying Left  Horizontal Canal Testing Horizontal Canal Right;Horizontal Canal Left;Horizontal Canal Right Intensity;Horizontal Canal Left Intensity  Dix-Hallpike Right  Dix-Hallpike Right Duration NA- no rotational nystagmus or symptoms  Dix-Hallpike Right Symptoms Right nystagmus (same as with other testing)  Dix-Hallpike Left  Dix-Hallpike Left Duration NA- no symptoms  Dix-Hallpike Left Symptoms Right nystagmus (same as with smooth pursuits.)  Sidelying Right  Sidelying Right Duration Symptoms- pt jumpped like she did with supine to sit transitions  Sidelying Right Symptoms Right nystagmus (same as with smooth pursuits)  Sidelying Left  Sidelying Left Duration NA- no symptoms or jumpping  Sidelying Left Symptoms Right nystagmus (same as with smooth pursuits)  Horizontal Canal Right  Horizontal Canal Right Duration NA- no symptoms  Horizontal Canal Right Symptoms Nystagmus (right beating)  Horizontal Canal Left  Horizontal Canal Left Duration Na- no symptoms  Horizontal Canal Left Symptoms Nystagmus (right beating)  Horizontal Canal Right Intensity  Horizontal Canal Right Intensity Mild  Right Intensity Comment mild shaking with roll to the right  Horizontal Canal Left Intensity  Horizontal Canal Left Intensity (None)  Left Intensity Comment no jump or shake when going  to the left.   Positional Sensitivities  Sit to Supine 2  Supine to Left Side 0  Supine to Right Side 2  Supine to Sitting 4  Right Hallpike 0  Up from Right Hallpike 4  Up from Left Hallpike 4  Head Turning x 5 0  Head Nodding x 5 0  Pivot Right in Standing 2 (used compensatory techniques)  Pivot Left in Standing 2 (used compensatory techniques)  Rolling Right 2  Rolling Left 0           PT Goals (current goals can now be found in the care plan section) Acute Rehab PT Goals Patient Stated Goal: to go back to normal level of independence.  Progress towards PT goals: Progressing toward goals    Frequency  Min 4X/week    PT Plan Discharge plan needs to be updated       End of Session Equipment Utilized During Treatment: Gait belt Activity Tolerance: Other (comment) (limited by imbalance) Patient left: in bed;with call bell/phone within reach;with family/visitor present     Time: 1950-9326 PT Time Calculation (min) (ACUTE ONLY): 72 min  Charges:  $Therapeutic Activity: 38-52 mins $Neuromuscular Re-education: 23-37 mins                      Titus Drone B. [place], [place], DPT [phone]   04/30/2015, 3:53 PM

## 2015-04-30 NOTE — Evaluation (Signed)
Occupational Therapy Evaluation Patient Details Name: Priscilla Leon MRN: [id number] DOB: [birth year] Today's Date: 04/30/2015    History of Present Illness 81 y.o. female admitted for peripheral vertigo, and hypokalemia.   Clinical Impression   Patient presenting with increased dizziness due to vertigo that effects her ability to carryout daily routines and transfers. Patient independent > mod I PTA. Patient currently requires total assist due to vertigo. Patient will benefit from acute OT to increase overall independence in the areas of ADLs, functional mobility, vestibular training, and overall safety in order to safely discharge home.     Follow Up Recommendations  Home health OT;Supervision/Assistance - 24 hour    Equipment Recommendations  3 in 1 bedside comode    Recommendations for Other Services  None at this time    Precautions / Restrictions Precautions Precautions: Fall Restrictions Weight Bearing Restrictions: No    Mobility Bed Mobility Overal bed mobility: Needs Assistance Bed Mobility: Rolling;Sidelying to Sit;Sit to Sidelying Rolling: Supervision Sidelying to sit: Min assist     Sit to sidelying: Mod assist General bed mobility comments: Increased assistance needed secondary to increased dizziness upon sitting, while sitting EOB, and prior to laying back down. Therapist assisted with BLEs back to bed due to increased dizziness.   Transfers General transfer comment: unable to tolerate at this time due to dizziness.    Balance Overall balance assessment: Needs assistance Sitting-balance support: No upper extremity supported;Feet unsupported Sitting balance-Leahy Scale: Poor Sitting balance - Comments: Upon sitting first time, pt unable to maintain static sitting balance. Second dime pt tolerated sitting EOB approx 1 minute unsupported before symptoms cause pt to lose balance and reach for bed for stability.    ADL Overall ADL's : Needs  assistance/impaired General ADL Comments: Pt currently overall total assist for ADLs secondary to vertigo and increased dizziness with any transitional movement. Pt unable to tolerate bed mobility and sitting EOB, pt very wobbly upon sitting EOB and unable to maintain static sitting balance. Therapist encouraged pt to keep eyes open and look at "A" stratgically placed in front of her. Pt unable to tolerate this.     Pertinent Vitals/Pain Pain Assessment: No/denies pain     Hand Dominance Right   Extremity/Trunk Assessment Upper Extremity Assessment Upper Extremity Assessment: Difficult to assess due to impaired cognition (difficult due to increased dizziness with any transitional movement)   Lower Extremity Assessment Lower Extremity Assessment: Defer to PT evaluation   Cervical / Trunk Assessment Cervical / Trunk Assessment: Normal   Communication Communication Communication: No difficulties   Cognition Arousal/Alertness: Awake/alert Behavior During Therapy: WFL for tasks assessed/performed Overall Cognitive Status: History of cognitive impairments - at baseline              Home Living Family/patient expects to be discharged to:: Private residence Living Arrangements: Spouse/significant other Available Help at Discharge: Family;Available 24 hours/day Type of Home: Independent living facility Home Access: [place]: One level     Bathroom Shower/Tub: Tub/shower unit;Walk-in shower;Door;Curtain   Bathroom Toilet: Standard     Home Equipment: Environmental consultant - 2 wheels;Cane - single point;Shower seat   Additional Comments: Daughter reports 24/7 supervision can be arranged for a short period of time - per previous entered data, per PT      Prior Functioning/Environment Level of Independence: Independent with assistive device(s)        Comments: Uses cane for mobility. Assists husband with ADLs    OT Diagnosis: Generalized weakness;Other (comment)  (vertigo)  OT Problem List: Decreased activity tolerance;Impaired balance (sitting and/or standing);Decreased safety awareness;Decreased knowledge of use of DME or AE;Other (comment) (vestibular training)   OT Treatment/Interventions: Therapeutic exercise;DME and/or AE instruction;Therapeutic activities;Patient/family education;Balance training    OT Goals(Current goals can be found in the care plan section) Acute Rehab OT Goals Patient Stated Goal: decrease dizziness OT Goal Formulation: With patient Time For Goal Achievement: 05/14/15 Potential to Achieve Goals: Good ADL Goals Pt Will Perform Grooming: with modified independence;standing Pt Will Perform Lower Body Bathing: with modified independence;sit to/from stand Pt Will Perform Lower Body Dressing: with modified independence;sit to/from stand Pt Will Transfer to Toilet: with modified independence;ambulating;bedside commode Pt Will Perform Tub/Shower Transfer: Shower transfer;shower seat;ambulating;with modified independence Additional ADL Goal #1: Pt will engage in bed mobility and tolerate sitting EOB at least 15 minutes for ADL  OT Frequency: Min 2X/week   Barriers to D/C: Decreased caregiver support   End of Session Nurse Communication: Mobility status  Activity Tolerance: Other (comment) (limited by dizziness, unable to fully assess transfers and ADL) Patient left: in bed;with call bell/phone within reach;with nursing/sitter in room   Time: 0947-1001 OT Time Calculation (min): 14 min Charges:  OT General Charges $OT Visit: 1 Procedure OT Evaluation $Initial OT Evaluation Tier I: 1 Procedure  Derryl Uher , MS, OTR/L, CLT Pager: [phone]  04/30/2015, 10:17 AM

## 2015-04-30 NOTE — Progress Notes (Signed)
PATIENT DETAILS Name: Priscilla Leon Age: 81 y.o. Sex: female Date of Birth: 1923-10-12 Admit Date: 04/28/2015 Admitting Physician Barton Dubois, MD PIR:[id number], Herschell Dimes, MD  Subjective: Vertigo present on leaning forward. Very anxious and tearful  Assessment/Plan: Principal Problem: Peripheral vertigo:suspect BPPV-still very symptomatic, continue Vestibular PT, Meclizine and prn Xanax.   Active Problems: Anxiety:continue prn Xanax-follow.  TKZ:SWFUXNATFT controlled-but very anxious-continue Amlodipine-will follow tomorrow to see if we need to make adjustment.   Dyslipidemia:continue Statin  Hx of CVA: no residual deficits-continue ASA. MRI Brain neg   Hypokalemia:replete and recheck  Hypomagnesemia:replete and recheck  Elevated Blood glucose on admission:A1C only at 5.8-no further work up required  Disposition: Remain inpatient  Antimicrobial agents  See below  Anti-infectives    None      DVT Prophylaxis: Prophylactic Lovenox   Code Status: Full code   Family Communication Grandson at bedside  Procedures: None  CONSULTS:  None  Time spent 25 minutes-Greater than 50% of this time was spent in counseling, explanation of diagnosis, planning of further management, and coordination of care.  MEDICATIONS: Scheduled Meds: . amLODipine  5 mg Oral Daily  . aspirin EC  81 mg Oral Daily  . cholecalciferol  2,000 Units Oral Daily  . enoxaparin (LOVENOX) injection  30 mg Subcutaneous Q24H  . Influenza vac split quadrivalent PF  0.5 mL Intramuscular Tomorrow-1000  . meclizine  12.5 mg Oral TID  . pravastatin  20 mg Oral q1800  . sertraline  50 mg Oral Daily  . vitamin B-12  250 mcg Oral Daily   Continuous Infusions:  PRN Meds:.acetaminophen **OR** acetaminophen, ALPRAZolam, alum & mag hydroxide-simeth, senna-docusate    PHYSICAL EXAM: Vital signs in last 24 hours: Filed Vitals:   04/29/15 0854 04/29/15 1000 04/29/15 2306  04/30/15 0607  BP: 142/53 157/46 140/50 160/73  Pulse: 60 60 64 81  Temp:  98.8 F (37.1 C) 99.2 F (37.3 C) 98.9 F (37.2 C)  TempSrc:  Oral Oral Oral  Resp: 18 20 20    Height:  5\' 2"  (1.575 m)    Weight:  47 kg (103 lb 9.9 oz)    SpO2: 96% 97% 96% 100%    Weight change:  Filed Weights   04/29/15 1000  Weight: 47 kg (103 lb 9.9 oz)   Body mass index is 18.95 kg/(m^2).   Gen Exam: Awake and alert with clear speech.   Neck: Supple, No JVD.   Chest: B/L Clear.   CVS: S1 S2 Regular, no murmurs.  Abdomen: soft, BS +, non tender, non distended.  Extremities: no edema, lower extremities warm to touch. Neurologic: Non Focal.+horizontal nystagmus on lateral gaze.  Skin: No Rash.   Wounds: N/A.   Intake/Output from previous day:  Intake/Output Summary (Last 24 hours) at 04/30/15 1258 Last data filed at 04/30/15 0929  Gross per 24 hour  Intake    636 ml  Output    500 ml  Net    136 ml     LAB RESULTS: CBC  Recent Labs Lab 04/29/15 0014  WBC 7.0  HGB 13.0  HCT 39.1  PLT 186  MCV 95.8  MCH 31.9  MCHC 33.2  RDW 13.0  LYMPHSABS 1.8  MONOABS 0.7  EOSABS 0.1  BASOSABS 0.0    Chemistries   Recent Labs Lab 04/29/15 0014 04/29/15 0828 04/30/15 0620  NA 138  --  138  K 3.0*  --  3.3*  CL 103  --  103  CO2 25  --  28  GLUCOSE 156*  --  92  BUN 16  --  7  CREATININE 0.75  --  0.55  CALCIUM 9.1  --  8.5*  MG  --  1.7  --     CBG: No results for input(s): GLUCAP in the last 168 hours.  GFR Estimated Creatinine Clearance: 35.4 mL/min (by C-G formula based on Cr of 0.55).  Coagulation profile No results for input(s): INR, PROTIME in the last 168 hours.  Cardiac Enzymes  Recent Labs Lab 04/29/15 0014  TROPONINI <0.03    Invalid input(s): POCBNP No results for input(s): DDIMER in the last 72 hours.  Recent Labs  04/29/15 1200  HGBA1C 5.8*   No results for input(s): CHOL, HDL, LDLCALC, TRIG, CHOLHDL, LDLDIRECT in the last 72  hours.  Recent Labs  04/29/15 1200  TSH 2.517    Recent Labs  04/29/15 1200  VITAMINB12 1332*   No results for input(s): LIPASE, AMYLASE in the last 72 hours.  Urine Studies No results for input(s): UHGB, CRYS in the last 72 hours.  Invalid input(s): UACOL, UAPR, USPG, UPH, UTP, UGL, UKET, UBIL, UNIT, UROB, ULEU, UEPI, UWBC, URBC, UBAC, CAST, UCOM, BILUA  MICROBIOLOGY: Recent Results (from the past 240 hour(s))  Urine culture     Status: None   Collection Time: 04/29/15 11:50 AM  Result Value Ref Range Status   Specimen Description URINE, CLEAN CATCH  Final   Special Requests NONE  Final   Culture MULTIPLE SPECIES PRESENT, SUGGEST RECOLLECTION  Final   Report Status 04/30/2015 FINAL  Final  MRSA PCR Screening     Status: None   Collection Time: 04/29/15 11:50 AM  Result Value Ref Range Status   MRSA by PCR NEGATIVE NEGATIVE Final    Comment:        The GeneXpert MRSA Assay (FDA approved for NASAL specimens only), is one component of a comprehensive MRSA colonization surveillance program. It is not intended to diagnose MRSA infection nor to guide or monitor treatment for MRSA infections.     RADIOLOGY STUDIES/RESULTS: Mr Herby Abraham Contrast  04/29/2015   CLINICAL DATA:  89 old female with history of stroke. Episodes of dizziness worse since yesterday. Initial encounter.  EXAM: MRI HEAD WITHOUT CONTRAST  TECHNIQUE: Multiplanar, multiecho pulse sequences of the brain and surrounding structures were obtained without intravenous contrast.  COMPARISON:  08/25/2009 head CT.  No comparison brain MR.  FINDINGS: No acute infarct.  Remote posterior left temporal - occipital lobe infarct with encephalomalacia and laminar necrosis/remote blood breakdown products.  Global atrophy without hydrocephalus.  No intracranial mass lesion noted on this unenhanced exam.  Major intracranial vascular structures are patent and ectatic.  Elongated asymmetric left globe.  Post right lens  replacement.  Right C2-3 facet joint degenerative changes. Mild transverse ligament prominence. Cervical medullary junction unremarkable.  Pituitary and pineal region within the range normal limits.  Minimal polypoid opacification inferior left maxillary sinus.  Prominent choroid cysts incidentally noted in without change from prior CT.  IMPRESSION: No acute infarct.  Remote posterior left temporal - occipital lobe infarct.  Global atrophy without hydrocephalus.   Electronically Signed   By: Genia Del M.D.   On: 04/29/2015 07:22    Oren Binet, MD  [facility] Pager:[phone]  If 7PM-7AM, please contact night-coverage [email or web address] Password [facility] 04/30/2015, 12:58 PM

## 2015-04-30 NOTE — Progress Notes (Signed)
Pt unable to tolerate sitting up or standing up for orthostatic. Unable to get orthostatic vitals.

## 2015-04-30 NOTE — Progress Notes (Signed)
Attempted to obtain orthostatic VS, but patient refused to sit up because she stated "I feel awful and dizzy when I move and I just want to be still". Will attempt later this morning.

## 2015-04-30 NOTE — Care Management Note (Deleted)
Case Management Note  Patient Details  Name: Priscilla Leon MRN: [id number] Date of Birth: 1924/08/25  Subjective/Objective:     NCM spoke with daughter, Jenny Reichmann [phone].  She  States patient is not able to get up and walk yet.  She also wanted a private duty list, NCM faxed it to her.  Patient is not able to ambulate yet, she is plus 2 asst per physical therapy and today they are rec SNF for patient..  Patient lives at home with her 24 year old spouse who would not  Be able to ast her.  NCM will cont to follow for dc needs.  NCM contacted daughter Jenny Reichmann and informed her of this recommendation from physical therapy.   Informed her that CSW will be in contact with her.            Action/Plan:   Expected Discharge Date:                  Expected Discharge Plan:  [place]  In-House Referral:     Discharge planning Services  CM Consult  Post Acute Care Choice:  Home Health Choice offered to:  Adult Children  DME Arranged:    DME Agency:     HH Arranged:  PT, OT [place] Agency:  [place]  Status of Service:  In process, will continue to follow  Medicare Important Message Given:    Date Medicare IM Given:    Medicare IM give by:    Date Additional Medicare IM Given:    Additional Medicare Important Message give by:     If discussed at [place] of Stay Meetings, dates discussed:    Additional Comments:  Zenon Mayo, RN 04/30/2015, 2:49 PM

## 2015-04-30 NOTE — Care Management Note (Signed)
Case Management Note  Patient Details  Name: Priscilla Leon MRN: [id number] Date of Birth: 1923-09-27  Subjective/Objective:     NCM spoke with daughter, Priscilla Leon [phone].  She  States patient is not able to get up and walk yet.  She also wanted a private duty list, NCM faxed it to her.  Patient is not able to ambulate yet, she is plus 2 asst per physical therapy and today they are rec SNF for patient..  Patient lives at home with her 79 year old spouse who would not  Be able to ast her.  NCM will cont to follow for dc needs.  NCM contacted daughter Priscilla Leon and informed her of this recommendation from physical therapy.   Informed her that CSW will be in contact with her.            Action/Plan:   Expected Discharge Date:                  Expected Discharge Plan:  [place]  In-House Referral:     Discharge planning Services  CM Consult  Post Acute Care Choice:    Choice offered to:     DME Arranged:    DME Agency:     HH Arranged:    [place] Agency:     Status of Service:  In process, will continue to follow  Medicare Important Message Given:    Date Medicare IM Given:    Medicare IM give by:    Date Additional Medicare IM Given:    Additional Medicare Important Message give by:     If discussed at [place] of Stay Meetings, dates discussed:    Additional Comments:  Zenon Mayo, RN 04/30/2015, 3:37 PM

## 2015-05-01 DIAGNOSIS — E876 Hypokalemia: Secondary | ICD-10-CM | POA: Diagnosis not present

## 2015-05-01 DIAGNOSIS — I10 Essential (primary) hypertension: Secondary | ICD-10-CM | POA: Diagnosis not present

## 2015-05-01 DIAGNOSIS — H81399 Other peripheral vertigo, unspecified ear: Secondary | ICD-10-CM | POA: Diagnosis not present

## 2015-05-01 LAB — BASIC METABOLIC PANEL
Anion gap: 8 (ref 5–15)
BUN: 15 mg/dL (ref 6–20)
CHLORIDE: 105 mmol/L (ref 101–111)
CO2: 27 mmol/L (ref 22–32)
Calcium: 8.8 mg/dL — ABNORMAL LOW (ref 8.9–10.3)
Creatinine, Ser: 0.7 mg/dL (ref 0.44–1.00)
GFR calc Af Amer: >60 mL/min (ref >60)
GFR calc non Af Amer: >60 mL/min (ref >60)
GLUCOSE: 94 mg/dL (ref 65–99)
POTASSIUM: 3.9 mmol/L (ref 3.5–5.1)
Sodium: 140 mmol/L (ref 135–145)

## 2015-05-01 LAB — MAGNESIUM: Magnesium: 2.1 mg/dL (ref 1.7–2.4)

## 2015-05-01 MED ORDER — AMLODIPINE BESYLATE 5 MG PO TABS: 5.0000 mg | ORAL_TABLET | Freq: Every day | ORAL | Status: AC

## 2015-05-01 MED ORDER — MECLIZINE HCL 12.5 MG PO TABS: 12.5000 mg | ORAL_TABLET | Freq: Three times a day (TID) | ORAL | Status: DC | PRN

## 2015-05-01 MED ORDER — ALPRAZOLAM 0.25 MG PO TABS: 0.2500 mg | ORAL_TABLET | Freq: Four times a day (QID) | ORAL | Status: DC | PRN

## 2015-05-01 NOTE — Care Management Note (Signed)
Case Management Note  Patient Details  Name: Priscilla Leon MRN: [id number] Date of Birth: Nov 03, 1923  Subjective/Objective:     Patient is for SNF today, CSW following .                 Action/Plan:   Expected Discharge Date:                  Expected Discharge Plan:  [place]  In-House Referral:  Clinical Social Work  Discharge planning Services  CM Consult  Post Acute Care Choice:    Choice offered to:     DME Arranged:    DME Agency:     HH Arranged:    [place] Agency:     Status of Service:  Completed, signed off  Medicare Important Message Given:    Date Medicare IM Given:    Medicare IM give by:    Date Additional Medicare IM Given:    Additional Medicare Important Message give by:     If discussed at [place] of Stay Meetings, dates discussed:    Additional Comments:  Zenon Mayo, RN 05/01/2015, 12:28 PM

## 2015-05-01 NOTE — Progress Notes (Signed)
CSW initiated bed search- [facility] is first choice- they have bed available and are looking at clinicals for final approval  CSW will continue to follow.  Domenica Reamer, [place] Social Worker [phone]

## 2015-05-01 NOTE — Discharge Summary (Signed)
PATIENT DETAILS Name: Priscilla Leon Age: 81 y.o. Sex: female Date of Birth: Aug 18, 1924 MRN: [id number]. Admitting Physician: Barton Dubois, MD TIW:[id number], Herschell Dimes, MD  Admit Date: 04/28/2015 Discharge date: 05/01/2015  Recommendations for Outpatient Follow-up:  1. Please repeat CBC/BMET at next visit 2. Please follow blood/urine cultures till final 3. Please continue vestibular PT while at SNF  PRIMARY DISCHARGE DIAGNOSIS:  Principal Problem:   Peripheral vertigo Active Problems:   Hypokalemia   HTN (hypertension)   Hyperglycemia   Dehydration   Vertigo      PAST MEDICAL HISTORY: Past Medical History  Diagnosis Date  . Stroke   . Anxiety   . Cancer     history of breast cancer    DISCHARGE MEDICATIONS: Current Discharge Medication List    START taking these medications   Details  meclizine (ANTIVERT) 12.5 MG tablet Take 1 tablet (12.5 mg total) by mouth 3 (three) times daily as needed for dizziness.      CONTINUE these medications which have CHANGED   Details  ALPRAZolam (XANAX) 0.25 MG tablet Take 1 tablet (0.25 mg total) by mouth every 6 (six) hours as needed for anxiety. Qty: 30 tablet, Refills: 0    amLODipine (NORVASC) 5 MG tablet Take 1 tablet (5 mg total) by mouth daily. Qty: 10 tablet, Refills: 0      CONTINUE these medications which have NOT CHANGED   Details  aspirin EC 81 MG tablet Take 81 mg by mouth daily.    Cholecalciferol (VITAMIN D) 2000 UNITS tablet Take 2,000 Units by mouth daily.    lovastatin (MEVACOR) 20 MG tablet Take 20 mg by mouth daily. Refills: 3    sertraline (ZOLOFT) 50 MG tablet Take 50 mg by mouth daily. Refills: 6    vitamin B-12 (CYANOCOBALAMIN) 250 MCG tablet Take 250 mcg by mouth daily.        ALLERGIES:  No Known Allergies  BRIEF HPI:  See H&P, Labs, Consult and Test reports for all details in brief, patient was admitted for valuation of vertigo  CONSULTATIONS:   None  PERTINENT RADIOLOGIC  STUDIES: Mr Brain Wo Contrast  04/29/2015   CLINICAL DATA:  68 old female with history of stroke. Episodes of dizziness worse since yesterday. Initial encounter.  EXAM: MRI HEAD WITHOUT CONTRAST  TECHNIQUE: Multiplanar, multiecho pulse sequences of the brain and surrounding structures were obtained without intravenous contrast.  COMPARISON:  08/25/2009 head CT.  No comparison brain MR.  FINDINGS: No acute infarct.  Remote posterior left temporal - occipital lobe infarct with encephalomalacia and laminar necrosis/remote blood breakdown products.  Global atrophy without hydrocephalus.  No intracranial mass lesion noted on this unenhanced exam.  Major intracranial vascular structures are patent and ectatic.  Elongated asymmetric left globe.  Post right lens replacement.  Right C2-3 facet joint degenerative changes. Mild transverse ligament prominence. Cervical medullary junction unremarkable.  Pituitary and pineal region within the range normal limits.  Minimal polypoid opacification inferior left maxillary sinus.  Prominent choroid cysts incidentally noted in without change from prior CT.  IMPRESSION: No acute infarct.  Remote posterior left temporal - occipital lobe infarct.  Global atrophy without hydrocephalus.   Electronically Signed   By: Genia Del M.D.   On: 04/29/2015 07:22     PERTINENT LAB RESULTS: CBC:  Recent Labs  04/29/15 0014  WBC 7.0  HGB 13.0  HCT 39.1  PLT 186   CMET CMP     Component Value Date/Time   NA 140 05/01/2015 0850  K 3.9 05/01/2015 0850   CL 105 05/01/2015 0850   CO2 27 05/01/2015 0850   GLUCOSE 94 05/01/2015 0850   BUN 15 05/01/2015 0850   CREATININE 0.70 05/01/2015 0850   CALCIUM 8.8* 05/01/2015 0850   GFRNONAA >60 05/01/2015 0850   GFRAA >60 05/01/2015 0850    GFR Estimated Creatinine Clearance: 35.4 mL/min (by C-G formula based on Cr of 0.7). No results for input(s): LIPASE, AMYLASE in the last 72 hours.  Recent Labs  04/29/15 0014   TROPONINI <0.03   Invalid input(s): POCBNP No results for input(s): DDIMER in the last 72 hours.  Recent Labs  04/29/15 1200  HGBA1C 5.8*   No results for input(s): CHOL, HDL, LDLCALC, TRIG, CHOLHDL, LDLDIRECT in the last 72 hours.  Recent Labs  04/29/15 1200  TSH 2.517    Recent Labs  04/29/15 1200  VITAMINB12 1332*   Coags: No results for input(s): INR in the last 72 hours.  Invalid input(s): PT Microbiology: Recent Results (from the past 240 hour(s))  Urine culture     Status: None   Collection Time: 04/29/15 11:50 AM  Result Value Ref Range Status   Specimen Description URINE, CLEAN CATCH  Final   Special Requests NONE  Final   Culture MULTIPLE SPECIES PRESENT, SUGGEST RECOLLECTION  Final   Report Status 04/30/2015 FINAL  Final  MRSA PCR Screening     Status: None   Collection Time: 04/29/15 11:50 AM  Result Value Ref Range Status   MRSA by PCR NEGATIVE NEGATIVE Final    Comment:        The GeneXpert MRSA Assay (FDA approved for NASAL specimens only), is one component of a comprehensive MRSA colonization surveillance program. It is not intended to diagnose MRSA infection nor to guide or monitor treatment for MRSA infections.      BRIEF HOSPITAL COURSE:  Peripheral vertigo:suspect BPPV-much improved with Vestibular PT, Meclizine and prn Xanax. Stable for discharge to SNF.  Active Problems: Anxiety:continue prn Xanax-follow.  OEV:[id number] controlled-increase Amlodipine to 10 mg-further optimization deferred to the outpatient setting.   Dyslipidemia:continue Statin  Hx of CVA: no residual deficits-continue ASA. MRI Brain neg   Hypokalemia:repleted  Hypomagnesemia:repleted  Elevated Blood glucose on admission:A1C only at 5.8-no further work up required  [place]:  Subjective:   Saory Carriero today has no headache,no chest abdominal pain,no new weakness tingling or numbness. Significantly less vertigo on leaning forward  today.  Objective:   Blood pressure 165/56, pulse 61, temperature 98.8 F (37.1 C), temperature source Oral, resp. rate 20, height 5\' 2"  (1.575 m), weight 47 kg (103 lb 9.9 oz), SpO2 95 %.  Intake/Output Summary (Last 24 hours) at 05/01/15 1057 Last data filed at 04/30/15 1823  Gross per 24 hour  Intake    290 ml  Output      0 ml  Net    290 ml   Filed Weights   04/29/15 1000  Weight: 47 kg (103 lb 9.9 oz)    Exam Awake Alert, Oriented *3, No new F.N deficits, Normal affect [place].AT,PERRAL Supple Neck,No JVD, No cervical lymphadenopathy appriciated.  Symmetrical Chest wall movement, Good air movement bilaterally, CTAB RRR,No Gallops,Rubs or new Murmurs, No Parasternal Heave +ve B.Sounds, Abd Soft, Non tender, No organomegaly appriciated, No rebound -guarding or rigidity. No Cyanosis, Clubbing or edema, No new Rash or bruise  DISCHARGE CONDITION: Stable  DISPOSITION: SNF  DISCHARGE INSTRUCTIONS:    Activity:  As tolerated with Full fall precautions use walker/cane &  assistance as needed  Get Medicines reviewed and adjusted: Please take all your medications with you for your next visit with your Primary MD  Please request your Primary MD to go over all hospital tests and procedure/radiological results at the follow up, please ask your Primary MD to get all Hospital records sent to his/her office.  If you experience worsening of your admission symptoms, develop shortness of breath, life threatening emergency, suicidal or homicidal thoughts you must seek medical attention immediately by calling 911 or calling your MD immediately  if symptoms less severe.  You must read complete instructions/literature along with all the possible adverse reactions/side effects for all the Medicines you take and that have been prescribed to you. Take any new Medicines after you have completely understood and accpet all the possible adverse reactions/side effects.   Do not drive when taking Pain  medications.   Do not take more than prescribed Pain, Sleep and Anxiety Medications  Special Instructions: If you have smoked or chewed Tobacco  in the last 2 yrs please stop smoking, stop any regular Alcohol  and or any Recreational drug use.  Wear Seat belts while driving.  Please note  You were cared for by a hospitalist during your hospital stay. Once you are discharged, your primary care physician will handle any further medical issues. Please note that NO REFILLS for any discharge medications will be authorized once you are discharged, as it is imperative that you return to your primary care physician (or establish a relationship with a primary care physician if you do not have one) for your aftercare needs so that they can reassess your need for medications and monitor your lab values.   Diet recommendation: Heart Healthy diet   Discharge Instructions    Call MD for:  persistant dizziness or light-headedness    Complete by:  As directed      Diet - low sodium heart healthy    Complete by:  As directed      Increase activity slowly    Complete by:  As directed            Follow-up Information    Follow up with Shamleffer, Herschell Dimes, MD. Schedule an appointment as soon as possible for a visit in 2 weeks.   Specialty:  Internal Medicine   Contact information:   [place]  [address] [place] [address] [phone]         Total Time spent on discharge equals 25 minutes.  SignedOren Binet 05/01/2015 10:57 AM

## 2015-05-01 NOTE — Progress Notes (Signed)
Physical Therapy Treatment Patient Details Name: Priscilla Leon MRN: [id number] DOB: Mar 12, 1924 Today's Date: 05/01/2015    History of Present Illness 81 y.o. female admitted for peripheral vertigo, and hypokalemia.    PT Comments    Significantly improved activity tolerance today.  PT hung "A" targets around the room so that we could practice compensations with gait with RW.  Pt is still a very high fall risk and needs constant hands-on assist to be safe on her feet.  She continues to be appropriate for SNF placement at discharge.   Follow Up Recommendations  SNF     Equipment Recommendations  Wheelchair (measurements PT);Wheelchair cushion (measurements PT)    Recommendations for Other Services   NA     Precautions / Restrictions Precautions Precautions: Fall Precaution Comments: very high fall risk  Restrictions Weight Bearing Restrictions: No    Mobility  Bed Mobility Overal bed mobility: Needs Assistance Bed Mobility: Supine to Sit;Sit to Supine Rolling: Min assist Sidelying to sit: Min assist Supine to sit: Min assist Sit to supine: Min assist   General bed mobility comments: Min assist to help support and control trunk during transitions.   Transfers Overall transfer level: Needs assistance Equipment used: Rolling walker (2 wheeled) Transfers: Sit to/from Stand Sit to Stand: Min assist;+2 safety/equipment Stand pivot transfers: +2 physical assistance;Min assist       General transfer comment: Two person min assist for safety, verbal cues for target compensation during transition.   Ambulation/Gait Ambulation/Gait assistance: +2 safety/equipment;Mod assist Ambulation Distance (Feet): 15 Feet (x2) Assistive device: Rolling walker (2 wheeled) Gait Pattern/deviations: Step-through pattern;Ataxic;Staggering left;Staggering right Gait velocity: decreased Gait velocity interpretation: <1.8 ft/sec, indicative of risk for recurrent falls General Gait Details:  Two person assist for safety during ambulation up to mod assist when she starts to have symptoms.  Pt able to follow commands when symptoms appear and stop, refocus on her target and then continue to walk.  Eyes, head, and body segmental turning used as well to help decrease imbalance and symptoms while turning.           Balance Overall balance assessment: Needs assistance Sitting-balance support: Feet supported;Bilateral upper extremity supported Sitting balance-Leahy Scale: Fair Sitting balance - Comments: min guard assist sitting EOB   Standing balance support: Bilateral upper extremity supported Standing balance-Leahy Scale: Poor                      Cognition Arousal/Alertness: Awake/alert Behavior During Therapy: Anxious (tearful throughout session) Overall Cognitive Status: History of cognitive impairments - at baseline       Memory: Decreased short-term memory                     Pertinent Vitals/Pain Pain Assessment: No/denies pain    PT Goals (current goals can now be found in the care plan section) Acute Rehab PT Goals Patient Stated Goal: to go back to normal level of independence.  Progress towards PT goals: Progressing toward goals    Frequency  Min 3X/week    PT Plan Current plan remains appropriate;Frequency needs to be updated       End of Session Equipment Utilized During Treatment: Gait belt Activity Tolerance: Other (comment) (limited by vertigo symptoms) Patient left: in bed;with call bell/phone within reach     Time: 1400-1430 PT Time Calculation (min) (ACUTE ONLY): 30 min  Charges:  $Gait Training: 23-37 mins            Hunt Zajicek B.  [place], [place], DPT [phone]   05/01/2015, 2:44 PM

## 2015-05-01 NOTE — Progress Notes (Signed)
Attempted to call report to the nurse at [facility]. There was no answer. Left a voicemail on answering machine with call back number.

## 2015-05-01 NOTE — Progress Notes (Signed)
Patient will discharge to [facility] SNF Anticipated discharge date:05/01/15 Family notified: pt son- Management consultant by [employer]- scheduled for 3pm  CSW signing off.  Domenica Reamer, [place] Social Worker [phone]

## 2015-05-01 NOTE — Progress Notes (Signed)
Occupational Therapy Treatment Patient Details Name: Priscilla Leon MRN: [id number] DOB: Mar 18, 1924 Today's Date: 05/01/2015    History of present illness 81 y.o. female admitted for peripheral vertigo, and hypokalemia.   OT comments  Pt with much improved activity tolerance today and appears to be a little anxious with activity but not bothered by the dizziness as in previous session. Some dizziness reported with repositioning in the bed/rolling. Plan is for SNF and son present for session. Explained goals of SNF for OT and pt also very appreciative of assist today.    Follow Up Recommendations  SNF;Supervision/Assistance - 24 hour    Equipment Recommendations  3 in 1 bedside comode    Recommendations for Other Services      Precautions / Restrictions Precautions Precautions: Fall Precaution Comments: very high fall risk  Restrictions Weight Bearing Restrictions: No       Mobility Bed Mobility Overal bed mobility: Needs Assistance Bed Mobility: Rolling;Sidelying to Sit;Sit to Supine Rolling: Min assist Sidelying to sit: Min assist   Sit to supine: Min assist   General bed mobility comments: min assist to facilitate rolling today. Min assist to guide trunk upright to sitting. Pt required cues for hand placement to self assist.   Transfers Overall transfer level: Needs assistance Equipment used: 2 person hand held assist Transfers: Sit to/from Stand;Stand Pivot Transfers Sit to Stand: +2 physical assistance;Min assist Stand pivot transfers: +2 physical assistance;Min assist       General transfer comment: encouraged to use targets during moblity. She did not have to close eyes today. cues for hand placement on 3in1 to self assist.     Balance       Sitting balance - Comments: min guard assist sitting EOB                           ADL                           Toilet Transfer: +2 for physical assistance;Minimal  assistance;Stand-pivot;BSC   Toileting- Clothing Manipulation and Hygiene: +2 for physical assistance;Moderate assistance;Sit to/from stand         General ADL Comments: Pt reports feeling anxious with activity but states she didnt feel the dizziness like she did yesterday. Difficult to pinpoint exact information from pt regarding dizziness today but per observation, pt tolerated supine to sit and pivot to [facility] much better today. Still had pt focus on 1 object throughout transitions. Son present for session as well as nursing. She did not have to close eyes today during activity. Added goal for clothing management but pt supposed to d/c to SNF hopefully today.       Vision                     Perception     Praxis      Cognition   Behavior During Therapy: Anxious (pt reports feeling alittle anxious. nursing in room and aware. ) Overall Cognitive Status: History of cognitive impairments - at baseline                       Extremity/Trunk Assessment               Exercises     Shoulder Instructions       General Comments      Pertinent Vitals/ Pain       Pain  Assessment: No/denies pain  Home Living                                          Prior Functioning/Environment              Frequency Min 2X/week     Progress Toward Goals  OT Goals(current goals can now be found in the care plan section)  Progress towards OT goals: Progressing toward goals Added toilet hygiene/clothing management goal.     Plan Discharge plan needs to be updated    Co-evaluation                 End of Session     Activity Tolerance Patient tolerated treatment well   Patient Left in bed;with call bell/phone within reach;with bed alarm set;with family/visitor present   Nurse Communication          Time: 1040-1057 OT Time Calculation (min): 17 min  Charges: OT General Charges $OT Visit: 1 Procedure OT Treatments $Therapeutic  Activity: 8-22 mins   [phone] 05/01/2015, 11:08 AM

## 2015-06-23 ENCOUNTER — Encounter (HOSPITAL_COMMUNITY): Payer: Self-pay

## 2015-06-23 ENCOUNTER — Emergency Department (HOSPITAL_COMMUNITY): Payer: Medicare Other

## 2015-06-23 ENCOUNTER — Emergency Department (HOSPITAL_COMMUNITY)
Admission: EM | Admit: 2015-06-23 | Discharge: 2015-06-23 | Disposition: A | Discharge status: Home / Self Care | Payer: Medicare Other | Attending: Emergency Medicine | Admitting: Emergency Medicine

## 2015-06-23 DIAGNOSIS — Z7982 Long term (current) use of aspirin: Secondary | ICD-10-CM | POA: Insufficient documentation

## 2015-06-23 DIAGNOSIS — F419 Anxiety disorder, unspecified: Secondary | ICD-10-CM | POA: Diagnosis not present

## 2015-06-23 DIAGNOSIS — Z8673 Personal history of transient ischemic attack (TIA), and cerebral infarction without residual deficits: Secondary | ICD-10-CM | POA: Insufficient documentation

## 2015-06-23 DIAGNOSIS — R079 Chest pain, unspecified: Secondary | ICD-10-CM | POA: Diagnosis present

## 2015-06-23 DIAGNOSIS — Z853 Personal history of malignant neoplasm of breast: Secondary | ICD-10-CM | POA: Insufficient documentation

## 2015-06-23 DIAGNOSIS — Z79899 Other long term (current) drug therapy: Secondary | ICD-10-CM | POA: Insufficient documentation

## 2015-06-23 DIAGNOSIS — R0789 Other chest pain: Secondary | ICD-10-CM | POA: Diagnosis not present

## 2015-06-23 LAB — CBC
HEMATOCRIT: 38.8 % (ref 36.0–46.0)
HEMOGLOBIN: 12.4 g/dL (ref 12.0–15.0)
MCH: 31 pg (ref 26.0–34.0)
MCHC: 32 g/dL (ref 30.0–36.0)
MCV: 97 fL (ref 78.0–100.0)
Platelets: 188 10*3/uL (ref 150–400)
RBC: 4 MIL/uL (ref 3.87–5.11)
RDW: 13.3 % (ref 11.5–15.5)
WBC: 5.4 10*3/uL (ref 4.0–10.5)

## 2015-06-23 LAB — BASIC METABOLIC PANEL
ANION GAP: 8 (ref 5–15)
BUN: 18 mg/dL (ref 6–20)
CO2: 27 mmol/L (ref 22–32)
Calcium: 9.4 mg/dL (ref 8.9–10.3)
Chloride: 108 mmol/L (ref 101–111)
Creatinine, Ser: 0.69 mg/dL (ref 0.44–1.00)
GFR calc Af Amer: >60 mL/min (ref >60)
GFR calc non Af Amer: >60 mL/min (ref >60)
GLUCOSE: 91 mg/dL (ref 65–99)
POTASSIUM: 4.1 mmol/L (ref 3.5–5.1)
Sodium: 143 mmol/L (ref 135–145)

## 2015-06-23 LAB — I-STAT TROPONIN, ED
Troponin i, poc: 0 ng/mL (ref 0.00–0.08)
Troponin i, poc: 0.03 ng/mL (ref 0.00–0.08)

## 2015-06-23 MED ORDER — ALPRAZOLAM 0.25 MG PO TABS
0.2500 mg | ORAL_TABLET | Freq: Once | ORAL | Status: AC
  Administered 2015-06-23: 0.25 mg via ORAL
  Filled 2015-06-23: qty 1

## 2015-06-23 NOTE — ED Notes (Signed)
Family is at bedside. °

## 2015-06-23 NOTE — Discharge Instructions (Signed)
As discussed, your evaluation today has been largely reassuring.  But, it is important that you monitor your condition carefully, and do not hesitate to return to the ED if you develop new, or concerning changes in your condition.  With your episode of chest pain, it is important that you follow-up with both your primary care physician and our cardiology colleagues.

## 2015-06-23 NOTE — ED Provider Notes (Signed)
CSN: [id number]     Arrival date & time 06/23/15  1018 History   First MD Initiated Contact with Patient 06/23/15 1018     Chief Complaint  Patient presents with  . Chest Pain     (Consider location/radiation/quality/duration/timing/severity/associated sxs/prior Treatment) HPI Patient presents after an episode of pain. Patient was sitting at breakfast, when she felt the sudden onset of pain in the left axilla. Pain was present for less than 1 minute.  Pain is sharp, nonradiating, moderate. No intervention performed prior to pain relief. There was no concurrent syncope, vomiting, diarrhea. Patient takes all medication as directed, denies medication changes recently. She denies any history of coronary disease.   Past Medical History  Diagnosis Date  . Stroke ([place])   . Anxiety   . Cancer [facility])     history of breast cancer   Past Surgical History  Procedure Laterality Date  . Breast surgery      partial right breast    History reviewed. No pertinent family history. Social History  Substance Use Topics  . Smoking status: Never Smoker   . Smokeless tobacco: Never Used  . Alcohol Use: No   OB History    No data available     Review of Systems  Constitutional:       Per HPI, otherwise negative  HENT:       Per HPI, otherwise negative  Respiratory:       Per HPI, otherwise negative  Cardiovascular:       Per HPI, otherwise negative  Gastrointestinal: Negative for vomiting.  Endocrine:       Negative aside from HPI  Genitourinary:       Neg aside from HPI   Musculoskeletal:       Per HPI, otherwise negative  Skin: Negative.   Neurological: Negative for syncope.      Allergies  Review of patient's allergies indicates no known allergies.  Home Medications   Prior to Admission medications   Medication Sig Start Date End Date Taking? Authorizing Provider  ALPRAZolam (XANAX) 0.25 MG tablet Take 1 tablet (0.25 mg total) by mouth every 6 (six) hours as needed  for anxiety. 05/01/15   Shanker Kristeen Mans, MD  amLODipine (NORVASC) 5 MG tablet Take 1 tablet (5 mg total) by mouth daily. 05/01/15   Shanker Kristeen Mans, MD  aspirin EC 81 MG tablet Take 81 mg by mouth daily.    Historical Provider, MD  Cholecalciferol (VITAMIN D) 2000 UNITS tablet Take 2,000 Units by mouth daily.    Historical Provider, MD  lovastatin (MEVACOR) 20 MG tablet Take 20 mg by mouth daily. 03/07/15   Historical Provider, MD  meclizine (ANTIVERT) 12.5 MG tablet Take 1 tablet (12.5 mg total) by mouth 3 (three) times daily as needed for dizziness. 05/01/15   Shanker Kristeen Mans, MD  sertraline (ZOLOFT) 50 MG tablet Take 50 mg by mouth daily. 03/07/15   Historical Provider, MD  vitamin B-12 (CYANOCOBALAMIN) 250 MCG tablet Take 250 mcg by mouth daily.    Historical Provider, MD   BP 156/70 mmHg  Pulse 65  Temp(Src) 98.6 F (37 C) (Oral)  Resp 16  SpO2 98% Physical Exam  Constitutional: She is oriented to person, place, and time. She appears well-developed and well-nourished. No distress.  HENT:  Head: Normocephalic and atraumatic.  Eyes: Conjunctivae and EOM are normal.  Cardiovascular: Normal rate and regular rhythm.   Pulmonary/Chest: Effort normal and breath sounds normal. No stridor. No respiratory distress.  Abdominal:  She exhibits no distension.  Musculoskeletal: She exhibits no edema.  Neurological: She is alert and oriented to person, place, and time. No cranial nerve deficit.  Skin: Skin is warm and dry.  Psychiatric: She has a normal mood and affect.  Nursing note and vitals reviewed.   ED Course  Procedures (including critical care time) Labs Review Labs Reviewed  BASIC METABOLIC PANEL  CBC  I-STAT [place], ED  [facility] Idol, ED    Imaging Review Dg Chest 2 View  06/23/2015  CLINICAL DATA:  Chest pain, weakness EXAM: CHEST  2 VIEW COMPARISON:  08/25/2009 FINDINGS: Chronic interstitial markings/emphysematous changes. No focal consolidation. Biapical  pleural-parenchymal scarring. No pleural effusion or pneumothorax. The heart is mildly enlarged. Degenerative changes of the visualized thoracolumbar spine. IMPRESSION: No evidence of acute cardiopulmonary disease. Electronically Signed   By: Julian Hy M.D.   On: 06/23/2015 12:25   I have personally reviewed and evaluated these images and lab results as part of my medical decision-making.   EKG Interpretation   Date/Time:  Monday June 23 2015 10:26:43 EDT Ventricular Rate:  65 PR Interval:  195 QRS Duration: 137 QT Interval:  509 QTC Calculation: 529 R Axis:   -34 Text Interpretation:  Sinus rhythm Left bundle branch block Baseline  wander in lead(s) I Left bundle branch block Abnormal ekg Confirmed by  Carmin Muskrat  MD ([id number]) on 06/23/2015 10:31:44 AM     3:28 PM Patient in no distress.  We discussed all findings at length.  She will f/u w PMD and cardiology. MDM  Patient presents after an episode of left-sided axilla pain that lasted less than 1 minute. Here the patient is awake, alert, in no distress. Patient has no evidence for ongoing coronary ischemia, pneumothorax, pulmonary embolism, pneumonia. Per hours of monitoring, she has no arrhythmia. 2 unremarkable troponin, nonischemic EKG are reassuring. Patient will follow-up with primary care and cardiology.   Carmin Muskrat, MD 06/23/15 [phone]

## 2015-06-23 NOTE — ED Notes (Signed)
Pt reports that she got in an argument with her daughter yesterday and has had increased anxiety since.  Pt was walking back from breakfast when she had a sudden sharp pain to the left axillary region.  The pain was sudden and lasted seconds.  Pt is pain free upon arrival with no other symptoms.

## 2015-06-23 NOTE — ED Notes (Signed)
Daughter was contacted and will come pick up pt.

## 2015-07-03 ENCOUNTER — Encounter (HOSPITAL_COMMUNITY): Payer: Self-pay | Admitting: *Deleted

## 2015-07-03 ENCOUNTER — Emergency Department (HOSPITAL_COMMUNITY)
Admission: EM | Admit: 2015-07-03 | Discharge: 2015-07-04 | Disposition: A | Discharge status: Home / Self Care | Payer: Medicare Other | Attending: Emergency Medicine | Admitting: Emergency Medicine

## 2015-07-03 ENCOUNTER — Emergency Department (HOSPITAL_COMMUNITY): Payer: Medicare Other

## 2015-07-03 DIAGNOSIS — R410 Disorientation, unspecified: Secondary | ICD-10-CM | POA: Diagnosis not present

## 2015-07-03 DIAGNOSIS — Y998 Other external cause status: Secondary | ICD-10-CM | POA: Insufficient documentation

## 2015-07-03 DIAGNOSIS — W19XXXA Unspecified fall, initial encounter: Secondary | ICD-10-CM

## 2015-07-03 DIAGNOSIS — S3992XA Unspecified injury of lower back, initial encounter: Secondary | ICD-10-CM | POA: Insufficient documentation

## 2015-07-03 DIAGNOSIS — Z853 Personal history of malignant neoplasm of breast: Secondary | ICD-10-CM | POA: Insufficient documentation

## 2015-07-03 DIAGNOSIS — Y9289 Other specified places as the place of occurrence of the external cause: Secondary | ICD-10-CM | POA: Insufficient documentation

## 2015-07-03 DIAGNOSIS — Y9389 Activity, other specified: Secondary | ICD-10-CM | POA: Diagnosis not present

## 2015-07-03 DIAGNOSIS — Z8543 Personal history of malignant neoplasm of ovary: Secondary | ICD-10-CM | POA: Diagnosis not present

## 2015-07-03 DIAGNOSIS — W01198A Fall on same level from slipping, tripping and stumbling with subsequent striking against other object, initial encounter: Secondary | ICD-10-CM | POA: Insufficient documentation

## 2015-07-03 DIAGNOSIS — S79912A Unspecified injury of left hip, initial encounter: Secondary | ICD-10-CM | POA: Insufficient documentation

## 2015-07-03 DIAGNOSIS — M545 Low back pain: Secondary | ICD-10-CM

## 2015-07-03 DIAGNOSIS — S0990XA Unspecified injury of head, initial encounter: Secondary | ICD-10-CM | POA: Insufficient documentation

## 2015-07-03 DIAGNOSIS — F419 Anxiety disorder, unspecified: Secondary | ICD-10-CM | POA: Diagnosis not present

## 2015-07-03 DIAGNOSIS — Z79899 Other long term (current) drug therapy: Secondary | ICD-10-CM | POA: Diagnosis not present

## 2015-07-03 DIAGNOSIS — Z7982 Long term (current) use of aspirin: Secondary | ICD-10-CM | POA: Diagnosis not present

## 2015-07-03 DIAGNOSIS — M25552 Pain in left hip: Secondary | ICD-10-CM

## 2015-07-03 DIAGNOSIS — R51 Headache: Secondary | ICD-10-CM

## 2015-07-03 DIAGNOSIS — R519 Headache, unspecified: Secondary | ICD-10-CM

## 2015-07-03 DIAGNOSIS — Z8673 Personal history of transient ischemic attack (TIA), and cerebral infarction without residual deficits: Secondary | ICD-10-CM | POA: Insufficient documentation

## 2015-07-03 HISTORY — DX: Malignant neoplasm of unspecified ovary: C56.9

## 2015-07-03 MED ORDER — ACETAMINOPHEN 325 MG PO TABS
650.0000 mg | ORAL_TABLET | Freq: Once | ORAL | Status: AC
  Administered 2015-07-03: 650 mg via ORAL
  Filled 2015-07-03: qty 2

## 2015-07-03 MED ORDER — HYDROCODONE-ACETAMINOPHEN 5-325 MG PO TABS
1.0000 | ORAL_TABLET | Freq: Once | ORAL | Status: AC
  Administered 2015-07-03: 1 via ORAL
  Filled 2015-07-03: qty 1

## 2015-07-03 NOTE — ED Provider Notes (Signed)
Medical screening examination/treatment/procedure(s) were conducted as a shared visit with non-physician practitioner(s) and myself.  I personally evaluated the patient during the encounter.   EKG Interpretation None       Results for orders placed or performed during the hospital encounter of 74/08/14  Basic metabolic panel  Result Value Ref Range   Sodium 143 135 - 145 mmol/L   Potassium 4.1 3.5 - 5.1 mmol/L   Chloride 108 101 - 111 mmol/L   CO2 27 22 - 32 mmol/L   Glucose, Bld 91 65 - 99 mg/dL   BUN 18 6 - 20 mg/dL   Creatinine, Ser 0.69 0.44 - 1.00 mg/dL   Calcium 9.4 8.9 - 10.3 mg/dL   GFR calc non Af Amer >60 >60 mL/min   GFR calc Af Amer >60 >60 mL/min   Anion gap 8 5 - 15  CBC  Result Value Ref Range   WBC 5.4 4.0 - 10.5 K/uL   RBC 4.00 3.87 - 5.11 MIL/uL   Hemoglobin 12.4 12.0 - 15.0 g/dL   HCT 38.8 36.0 - 46.0 %   MCV 97.0 78.0 - 100.0 fL   MCH 31.0 26.0 - 34.0 pg   MCHC 32.0 30.0 - 36.0 g/dL   RDW 13.3 11.5 - 15.5 %   Platelets 188 150 - 400 K/uL  I-stat troponin, ED  Result Value Ref Range   Troponin i, poc 0.00 0.00 - 0.08 ng/mL   Comment 3          I-stat troponin, ED  Result Value Ref Range   Troponin i, poc 0.03 0.00 - 0.08 ng/mL   Comment 3           Dg Chest 2 View  06/23/2015  CLINICAL DATA:  Chest pain, weakness EXAM: CHEST  2 VIEW COMPARISON:  08/25/2009 FINDINGS: Chronic interstitial markings/emphysematous changes. No focal consolidation. Biapical pleural-parenchymal scarring. No pleural effusion or pneumothorax. The heart is mildly enlarged. Degenerative changes of the visualized thoracolumbar spine. IMPRESSION: No evidence of acute cardiopulmonary disease. Electronically Signed   By: Julian Hy M.D.   On: 06/23/2015 12:25   Dg Lumbar Spine Complete  07/03/2015  CLINICAL DATA:  Witnessed fall, striking the back her head on a metal door frame. No loss of consciousness. EXAM: LUMBAR SPINE - COMPLETE 4+ VIEW COMPARISON:  None. FINDINGS: The  lumbar vertebrae are normal in height. Vertebral column appears intact. No acute fracture is evident. No acute soft tissue abnormality is evident. Moderate degenerative disc changes are present, greatest at L4-5. No bone lesion or bony destruction. IMPRESSION: Negative for acute lumbar spine fracture. Electronically Signed   By: Andreas Newport M.D.   On: 07/03/2015 21:40   Ct Head Wo Contrast  07/03/2015  CLINICAL DATA:  Fall.  Hematoma.  History stroke. EXAM: CT HEAD WITHOUT CONTRAST TECHNIQUE: Contiguous axial images were obtained from the base of the skull through the vertex without intravenous contrast. COMPARISON:  04/29/2015 brain MR. 08/25/2009 head CT. FINDINGS: Sinuses/Soft tissues: Left parietal/occipital scalp soft tissue swelling is mild to moderate. Clear paranasal sinuses and mastoid air cells. No underlying skull fracture. Intracranial: Cerebral and cerebellar atrophy which is age appropriate. Mild low density in the periventricular white matter likely related to small vessel disease. Remote left parietal/occipital infarct. A new tiny extra-axial hemorrhage adjacent the left frontal lobe is likely subdural. This measures 4 mm (image 16, series 2). No mass effect or midline shift. No hydrocephalus, mass lesion, acute infarct, or intra-axial fluid collection. IMPRESSION: 1. Left  parietal scalp soft tissue swelling. 2. Tiny extra-axial hematoma adjacent the left frontal lobe, likely subdural. Critical test results telephoned to [facility]. at the time of interpretation at 10:10 p.m.on 07/30/2015. Electronically Signed   By: Abigail Miyamoto M.D.   On: 07/03/2015 22:11   Dg Hip Unilat With Pelvis 2-3 Views Left  07/03/2015  CLINICAL DATA:  81 year old nursing home patient who fell backwards into her closet and now complains of pelvic pain and left hip pain. Initial encounter. EXAM: DG HIP (WITH OR WITHOUT PELVIS) 2-3V LEFT COMPARISON:  None. FINDINGS: No evidence of acute fracture or dislocation.  Moderate inferomedial joint space narrowing. Labral calcifications. Included AP pelvis demonstrates symmetric moderate inferomedial joint space narrowing in the contralateral right hip. Sacroiliac joints and symphysis pubis intact. No fractures elsewhere. Moderate osseous demineralization. Degenerative changes involving the visualized lower lumbar spine. IMPRESSION: 1. No acute osseous abnormality. 2. Moderate osteoarthritis. 3. Osseous demineralization. Electronically Signed   By: Evangeline Dakin M.D.   On: 07/03/2015 21:43    Patient seen by me. Patient had a witnessed fall by her husband and independent living. Patient fell backwards into her closet. No loss of consciousness. She hit the back of her head. She has a hematoma there. Patient with complaint of headache the patient's main complaint now is just low back pain. Initially patient was confused and dazed. But has regained her mental faculties. No neuro focal deficits.  CT of the head shows a tiny extra-axial hematoma adjacent to the left frontal lobe likely subdural. Dr. Vertell Limber from neurosurgery is evaluating that CT and will give a recommendation. X-rays of her hip and pelvis and low back without any acute injuries. Patient will be treated with some pain medicine to see if it helps her low back pain. See if patient is able to ambulate better.  Fredia Sorrow, MD 07/03/15 [phone]

## 2015-07-03 NOTE — ED Notes (Addendum)
Pt states she was reaching for something and stepped off the end of the bed to get to her walker. Denies LOC, she states she hurt so hard and wanted her husband to get her ice and help right away. She takes a baby ASA daily.   Pain with palpation on lf hip and coccyx.

## 2015-07-03 NOTE — ED Provider Notes (Signed)
CSN: [id number]     Arrival date & time 07/03/15  2001 History   First MD Initiated Contact with Patient 07/03/15 2011     No chief complaint on file.  HPI  Priscilla Leon is an 81 year old female with past medical history of stroke and anxiety presenting after a fall. She states that she was sitting on the edge of her bed when she went to reach for her walker and lost her balance. She fell backwards onto her buttocks and hit her head on the door frame of her closet. She denies loss of consciousness. Her husband states that she was initially confused after the fall but soon returned to mental baseline. She states that she was unable to pull herself up after the fall and had to call EMS. She is complaining of coccyx and left hip pain. She states that the "bump" on her left hip is tender when she touches it. Denies hip pain with leg movement. She reports her tailbone is aching. This ache increases with movement of her legs. Denies pain radiating down the backs of her thighs. She denies chest pain, shortness of breath, dizziness, diaphoresis or nausea immediately before or following fall. She endorses a hematoma to the left posterior occiput that is throbbing. Denies dizziness, blurred vision, neck pain, back pain or wounds sustained in the fall. Daughter is at bedside and reports her mother is at baseline. She takes aspirin 81 mg per day. Denies other anticoagulants. She has no other complaints today.  Past Medical History  Diagnosis Date  . Stroke ([place])   . Anxiety   . Cancer [facility])     history of breast cancer  . Ovarian cancer [facility])    Past Surgical History  Procedure Laterality Date  . Breast surgery      partial right breast   . Appendectomy    . Tonsillectomy     No family history on file. Social History  Substance Use Topics  . Smoking status: Never Smoker   . Smokeless tobacco: Never Used  . Alcohol Use: 0.6 oz/week    1 Glasses of wine per week     Comment: rarely   OB History     No data available     Review of Systems  Constitutional: Negative for diaphoresis.  Eyes: Negative for visual disturbance.  Respiratory: Negative for shortness of breath.   Cardiovascular: Negative for chest pain.  Gastrointestinal: Negative for nausea, vomiting and abdominal pain.  Musculoskeletal: Positive for back pain and arthralgias. Negative for joint swelling, gait problem and neck pain.  Skin: Negative for wound.  Neurological: Positive for headaches. Negative for dizziness, syncope, weakness and numbness.  Hematological: Does not bruise/bleed easily.  Psychiatric/Behavioral: Positive for confusion.  All other systems reviewed and are negative.     Allergies  Review of patient's allergies indicates no known allergies.  Home Medications   Prior to Admission medications   Medication Sig Start Date End Date Taking? Authorizing Provider  ALPRAZolam (XANAX) 0.25 MG tablet Take 0.25 mg by mouth every 6 (six) hours as needed for anxiety.   Yes Historical Provider, MD  amLODipine (NORVASC) 5 MG tablet Take 1 tablet (5 mg total) by mouth daily. 05/01/15  Yes Shanker Kristeen Mans, MD  aspirin EC 81 MG tablet Take 81 mg by mouth every evening.    Yes Historical Provider, MD  Cholecalciferol (VITAMIN D) 2000 UNITS tablet Take 2,000 Units by mouth daily.   Yes Historical Provider, MD  lovastatin (MEVACOR) 20 MG  tablet Take 20 mg by mouth every evening.  03/07/15  Yes Historical Provider, MD  sertraline (ZOLOFT) 50 MG tablet Take 50 mg by mouth daily. 03/07/15  Yes Historical Provider, MD  vitamin B-12 (CYANOCOBALAMIN) 250 MCG tablet Take 250 mcg by mouth daily.   Yes Historical Provider, MD  oxyCODONE-acetaminophen (PERCOCET/ROXICET) 5-325 MG tablet Take 1 tablet by mouth every 6 (six) hours as needed for severe pain. 07/04/15   Muslima Toppins, PA-C   BP 145/43 mmHg  Pulse 66  Temp(Src) 98.1 F (36.7 C) (Oral)  Resp 16  SpO2 96% Physical Exam  Constitutional: She is oriented to person,  place, and time. She appears well-developed and well-nourished. No distress.  HENT:  Head: Normocephalic and atraumatic.  Mouth/Throat: Oropharynx is clear and moist.  Eyes: Conjunctivae and EOM are normal. Pupils are equal, round, and reactive to light. Right eye exhibits no discharge. Left eye exhibits no discharge. No scleral icterus.  Neck: Normal range of motion. Neck supple.  No cervical spine tenderness. No bony deformities or step-offs.  Cardiovascular: Normal rate, regular rhythm, normal heart sounds and intact distal pulses.   Pedal pulses palpable  Pulmonary/Chest: Effort normal and breath sounds normal. No respiratory distress. She has no wheezes. She has no rales.  Breathing unlabored  Abdominal: Soft. She exhibits no distension. There is no tenderness.  Musculoskeletal: Normal range of motion.  Patient moves extremities spontaneously. No obvious deformity of the bilateral lower extremities. Patient able to straight leg raise without pain. Some mild tenderness over the left lateral hip. Patient with tenderness over the coccyx. Moves upper extremities without pain. No obvious deformities or edema of upper extremities.   Neurological: She is alert and oriented to person, place, and time. No cranial nerve deficit. Coordination normal.  GCS 15. Cranial nerves III through XII tested and intact. 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 5 out of 5 strength in all major muscle groups. Sensation to light touch intact throughout.  Skin: Skin is warm and dry.  No ecchymosis, abrasions or lacerations present.  Psychiatric: She has a normal mood and affect. Her behavior is normal.  Nursing note and vitals reviewed.   ED Course  Procedures (including critical care time) Labs Review Labs Reviewed - No data to display  Imaging Review Dg Lumbar Spine Complete  07/03/2015  CLINICAL DATA:  Witnessed fall, striking the back her head on a metal door frame. No loss of consciousness. EXAM:  LUMBAR SPINE - COMPLETE 4+ VIEW COMPARISON:  None. FINDINGS: The lumbar vertebrae are normal in height. Vertebral column appears intact. No acute fracture is evident. No acute soft tissue abnormality is evident. Moderate degenerative disc changes are present, greatest at L4-5. No bone lesion or bony destruction. IMPRESSION: Negative for acute lumbar spine fracture. Electronically Signed   By: Andreas Newport M.D.   On: 07/03/2015 21:40   Ct Head Wo Contrast  07/03/2015  CLINICAL DATA:  Fall.  Hematoma.  History stroke. EXAM: CT HEAD WITHOUT CONTRAST TECHNIQUE: Contiguous axial images were obtained from the base of the skull through the vertex without intravenous contrast. COMPARISON:  04/29/2015 brain MR. 08/25/2009 head CT. FINDINGS: Sinuses/Soft tissues: Left parietal/occipital scalp soft tissue swelling is mild to moderate. Clear paranasal sinuses and mastoid air cells. No underlying skull fracture. Intracranial: Cerebral and cerebellar atrophy which is age appropriate. Mild low density in the periventricular white matter likely related to small vessel disease. Remote left parietal/occipital infarct. A new tiny extra-axial hemorrhage adjacent the left frontal lobe  is likely subdural. This measures 4 mm (image 16, series 2). No mass effect or midline shift. No hydrocephalus, mass lesion, acute infarct, or intra-axial fluid collection. IMPRESSION: 1. Left parietal scalp soft tissue swelling. 2. Tiny extra-axial hematoma adjacent the left frontal lobe, likely subdural. Critical test results telephoned to [facility]. at the time of interpretation at 10:10 p.m.on 07/30/2015. Electronically Signed   By: Abigail Miyamoto M.D.   On: 07/03/2015 22:11   Dg Hip Unilat With Pelvis 2-3 Views Left  07/03/2015  CLINICAL DATA:  81 year old nursing home patient who fell backwards into her closet and now complains of pelvic pain and left hip pain. Initial encounter. EXAM: DG HIP (WITH OR WITHOUT PELVIS) 2-3V LEFT  COMPARISON:  None. FINDINGS: No evidence of acute fracture or dislocation. Moderate inferomedial joint space narrowing. Labral calcifications. Included AP pelvis demonstrates symmetric moderate inferomedial joint space narrowing in the contralateral right hip. Sacroiliac joints and symphysis pubis intact. No fractures elsewhere. Moderate osseous demineralization. Degenerative changes involving the visualized lower lumbar spine. IMPRESSION: 1. No acute osseous abnormality. 2. Moderate osteoarthritis. 3. Osseous demineralization. Electronically Signed   By: Evangeline Dakin M.D.   On: 07/03/2015 21:43   I have personally reviewed and evaluated these images and lab results as part of my medical decision-making.   EKG Interpretation None      MDM   Final diagnoses:  Fall, initial encounter  Headache, unspecified headache type  Low back pain without sciatica, unspecified back pain laterality  Left hip pain   Patient presenting after a mechanical fall and hit her head on the frame of her closet. Denies loss of consciousness. Complaining of headache, coccyx and left hip pain. Full range of motion of hips without pain. Mild tenderness to palpation of left lateral hip. No obvious deformity. Nonfocal neurological exam. 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 Pain controlled with Percocet. Lumbar and pelvis x-ray negative. Radiologist called with report of tiny extra axial hematoma likely a subdural. Consulted neurosurgery, Dr. Vertell Limber, who reviewed the CT results. He does not recommend any follow-up and believes the patient is stable for discharge. Pain well controlled with Percocet in the emergency department. Patient able to ambulate with a steady gait with a walker. Patient is appropriate for discharge. Daughter is upset that we are not admitting patient. Had discussion that admission is not indicated at this time. Explained that pain is well controlled and she is ambulate at baseline. Return precautions  given in discharge paperwork and discussed with pt at bedside. Pt stable for discharge    Josephina Gip, PA-C 07/04/15 0045

## 2015-07-03 NOTE — ED Notes (Signed)
Bed: WA09 Expected date:  Expected time:  Means of arrival:  Comments: EMS 81yo F; fall struck Head, no LOC hematoma

## 2015-07-03 NOTE — ED Notes (Signed)
MD at bedside. Family at bedside: husband and daughter

## 2015-07-03 NOTE — ED Notes (Signed)
Pt arrived via EMS from [facility] facility. Arrived for witnessed  Fall by husband. He called EMS  after pt fell backward  into her closet, no LOC.  When she hit her left a dent in the metal door frame. She has a large hemotoma on the back of her head, HA 8/10. Per EMS, she was confused and couldn't answer question appropriately. however was then able to regain and answer questions appropriately once people were removed from the scene. No bleeding, posturing, PEARL(via EMS), pt is blind in lf eye.

## 2015-07-04 MED ORDER — OXYCODONE-ACETAMINOPHEN 5-325 MG PO TABS: 1.0000 | ORAL_TABLET | Freq: Four times a day (QID) | ORAL | Status: DC | PRN

## 2015-07-04 NOTE — Discharge Instructions (Signed)
Use Percocet for pain. May use heat or ice on hip and lower back for pain relief as well. Follow up with PCP if pain persists. Continue to use walker when walking at home. Return to emergency department with her sitting headache, changes in vision, confusion, numbness or weakness in the extremities, nausea, vomiting or any other new or concerning symptoms.   Cryotherapy Cryotherapy means treatment with cold. Ice or gel packs can be used to reduce both pain and swelling. Ice is the most helpful within the first 24 to 48 hours after an injury or flare-up from overusing a muscle or joint. Sprains, strains, spasms, burning pain, shooting pain, and aches can all be eased with ice. Ice can also be used when recovering from surgery. Ice is effective, has very few side effects, and is safe for most people to use. PRECAUTIONS  Ice is not a safe treatment option for people with:  Raynaud phenomenon. This is a condition affecting small blood vessels in the extremities. Exposure to cold may cause your problems to return.  Cold hypersensitivity. There are many forms of cold hypersensitivity, including:  Cold urticaria. Red, itchy hives appear on the skin when the tissues begin to warm after being iced.  Cold erythema. This is a red, itchy rash caused by exposure to cold.  Cold hemoglobinuria. Red blood cells break down when the tissues begin to warm after being iced. The hemoglobin that carry oxygen are passed into the urine because they cannot combine with blood proteins fast enough.  Numbness or altered sensitivity in the area being iced. If you have any of the following conditions, do not use ice until you have discussed cryotherapy with your caregiver:  Heart conditions, such as arrhythmia, angina, or chronic heart disease.  High blood pressure.  Healing wounds or open skin in the area being iced.  Current infections.  Rheumatoid arthritis.  Poor circulation.  Diabetes. Ice slows the blood  flow in the region it is applied. This is beneficial when trying to stop inflamed tissues from spreading irritating chemicals to surrounding tissues. However, if you expose your skin to cold temperatures for too long or without the proper protection, you can damage your skin or nerves. Watch for signs of skin damage due to cold. HOME CARE INSTRUCTIONS Follow these tips to use ice and cold packs safely.  Place a dry or damp towel between the ice and skin. A damp towel will cool the skin more quickly, so you may need to shorten the time that the ice is used.  For a more rapid response, add gentle compression to the ice.  Ice for no more than 10 to 20 minutes at a time. The bonier the area you are icing, the less time it will take to get the benefits of ice.  Check your skin after 5 minutes to make sure there are no signs of a poor response to cold or skin damage.  Rest 20 minutes or more between uses.  Once your skin is numb, you can end your treatment. You can test numbness by very lightly touching your skin. The touch should be so light that you do not see the skin dimple from the pressure of your fingertip. When using ice, most people will feel these normal sensations in this order: cold, burning, aching, and numbness.  Do not use ice on someone who cannot communicate their responses to pain, such as small children or people with dementia. HOW TO MAKE AN ICE PACK Ice packs are  the most common way to use ice therapy. Other methods include ice massage, ice baths, and cryosprays. Muscle creams that cause a cold, tingly feeling do not offer the same benefits that ice offers and should not be used as a substitute unless recommended by your caregiver. To make an ice pack, do one of the following:  Place crushed ice or a bag of frozen vegetables in a sealable plastic bag. Squeeze out the excess air. Place this bag inside another plastic bag. Slide the bag into a pillowcase or place a damp towel  between your skin and the bag.  Mix 3 parts water with 1 part rubbing alcohol. Freeze the mixture in a sealable plastic bag. When you remove the mixture from the freezer, it will be slushy. Squeeze out the excess air. Place this bag inside another plastic bag. Slide the bag into a pillowcase or place a damp towel between your skin and the bag. SEEK MEDICAL CARE IF:  You develop white spots on your skin. This may give the skin a blotchy (mottled) appearance.  Your skin turns blue or pale.  Your skin becomes waxy or hard.  Your swelling gets worse. MAKE SURE YOU:   Understand these instructions.  Will watch your condition.  Will get help right away if you are not doing well or get worse.   This information is not intended to replace advice given to you by your health care provider. Make sure you discuss any questions you have with your health care provider.   Document Released: 04/12/2011 Document Revised: 09/06/2014 Document Reviewed: 04/12/2011 Elsevier Interactive Patient Education 2016 [place] Headache Without Cause A headache is pain or discomfort felt around the head or neck area. The specific cause of a headache may not be found. There are many causes and types of headaches. A few common ones are:  Tension headaches.  Migraine headaches.  Cluster headaches.  Chronic daily headaches. HOME CARE INSTRUCTIONS  Watch your condition for any changes. Take these steps to help with your condition: Managing Pain  Take over-the-counter and prescription medicines only as told by your health care provider.  Lie down in a dark, quiet room when you have a headache.  If directed, apply ice to the head and neck area:  Put ice in a plastic bag.  Place a towel between your skin and the bag.  Leave the ice on for 20 minutes, 2-3 times per day.  Use a heating pad or hot shower to apply heat to the head and neck area as told by your health care provider.  Keep  lights dim if bright lights bother you or make your headaches worse. Eating and Drinking  Eat meals on a regular schedule.  Limit alcohol use.  Decrease the amount of caffeine you drink, or stop drinking caffeine. General Instructions  Keep all follow-up visits as told by your health care provider. This is important.  Keep a headache journal to help find out what may trigger your headaches. For example, write down:  What you eat and drink.  How much sleep you get.  Any change to your diet or medicines.  Try massage or other relaxation techniques.  Limit stress.  Sit up straight, and do not tense your muscles.  Do not use tobacco products, including cigarettes, chewing tobacco, or e-cigarettes. If you need help quitting, ask your health care provider.  Exercise regularly as told by your health care provider.  Sleep on a regular schedule. Get 7-9 hours  of sleep, or the amount recommended by your health care provider. SEEK MEDICAL CARE IF:   Your symptoms are not helped by medicine.  You have a headache that is different from the usual headache.  You have nausea or you vomit.  You have a fever. SEEK IMMEDIATE MEDICAL CARE IF:   Your headache becomes severe.  You have repeated vomiting.  You have a stiff neck.  You have a loss of vision.  You have problems with speech.  You have pain in the eye or ear.  You have muscular weakness or loss of muscle control.  You lose your balance or have trouble walking.  You feel faint or pass out.  You have confusion.   This information is not intended to replace advice given to you by your health care provider. Make sure you discuss any questions you have with your health care provider.   Document Released: 08/16/2005 Document Revised: 05/07/2015 Document Reviewed: 12/09/2014 Elsevier Interactive Patient Education 2016 [place] therapy can help ease sore, stiff, injured, and tight muscles and  joints. Heat relaxes your muscles, which may help ease your pain.  RISKS AND COMPLICATIONS If you have any of the following conditions, do not use heat therapy unless your health care provider has approved:  Poor circulation.  Healing wounds or scarred skin in the area being treated.  Diabetes, heart disease, or high blood pressure.  Not being able to feel (numbness) the area being treated.  Unusual swelling of the area being treated.  Active infections.  Blood clots.  Cancer.  Inability to communicate pain. This may include young children and people who have problems with their brain function (dementia).  Pregnancy. Heat therapy should only be used on old, pre-existing, or long-lasting (chronic) injuries. Do not use heat therapy on new injuries unless directed by your health care provider. HOW TO USE HEAT THERAPY There are several different kinds of heat therapy, including:  Moist heat pack.  Warm water bath.  Hot water bottle.  Electric heating pad.  Heated gel pack.  Heated wrap.  Electric heating pad. Use the heat therapy method suggested by your health care provider. Follow your health care provider's instructions on when and how to use heat therapy. GENERAL HEAT THERAPY RECOMMENDATIONS  Do not sleep while using heat therapy. Only use heat therapy while you are awake.  Your skin may turn pink while using heat therapy. Do not use heat therapy if your skin turns red.  Do not use heat therapy if you have new pain.  High heat or long exposure to heat can cause burns. Be careful when using heat therapy to avoid burning your skin.  Do not use heat therapy on areas of your skin that are already irritated, such as with a rash or sunburn. SEEK MEDICAL CARE IF:  You have blisters, redness, swelling, or numbness.  You have new pain.  Your pain is worse. MAKE SURE YOU:  Understand these instructions.  Will watch your condition.  Will get help right away if  you are not doing well or get worse.   This information is not intended to replace advice given to you by your health care provider. Make sure you discuss any questions you have with your health care provider.   Document Released: 11/08/2011 Document Revised: 09/06/2014 Document Reviewed: 10/09/2013 Elsevier Interactive Patient Education 2016 [employer]  Hip Pain Your hip is the joint between your upper legs and your lower pelvis. The bones, cartilage,  tendons, and muscles of your hip joint perform a lot of work each day supporting your body weight and allowing you to move around. Hip pain can range from a minor ache to severe pain in one or both of your hips. Pain may be felt on the inside of the hip joint near the groin, or the outside near the buttocks and upper thigh. You may have swelling or stiffness as well.  HOME CARE INSTRUCTIONS   Take medicines only as directed by your health care provider.  Apply ice to the injured area:  Put ice in a plastic bag.  Place a towel between your skin and the bag.  Leave the ice on for 15-20 minutes at a time, 3-4 times a day.  Keep your leg raised (elevated) when possible to lessen swelling.  Avoid activities that cause pain.  Follow specific exercises as directed by your health care provider.  Sleep with a pillow between your legs on your most comfortable side.  Record how often you have hip pain, the location of the pain, and what it feels like. SEEK MEDICAL CARE IF:   You are unable to put weight on your leg.  Your hip is red or swollen or very tender to touch.  Your pain or swelling continues or worsens after 1 week.  You have increasing difficulty walking.  You have a fever. SEEK IMMEDIATE MEDICAL CARE IF:   You have fallen.  You have a sudden increase in pain and swelling in your hip. MAKE SURE YOU:   Understand these instructions.  Will watch your condition.  Will get help right away if you are not doing well or  get worse.   This information is not intended to replace advice given to you by your health care provider. Make sure you discuss any questions you have with your health care provider.   Document Released: 02/03/2010 Document Revised: 09/06/2014 Document Reviewed: 04/12/2013 Elsevier Interactive Patient Education 2016 [employer] Inc[employer]  Musculoskeletal Pain Musculoskeletal pain is muscle and boney aches and pains. These pains can occur in any part of the body. Your caregiver may treat you without knowing the cause of the pain. They may treat you if blood or urine tests, X-rays, and other tests were normal.  CAUSES There is often not a definite cause or reason for these pains. These pains may be caused by a type of germ (virus). The discomfort may also come from overuse. Overuse includes working out too hard when your body is not fit. Boney aches also come from weather changes. Bone is sensitive to atmospheric pressure changes. HOME CARE INSTRUCTIONS   Ask when your test results will be ready. Make sure you get your test results.  Only take over-the-counter or prescription medicines for pain, discomfort, or fever as directed by your caregiver. If you were given medications for your condition, do not drive, operate machinery or power tools, or sign legal documents for 24 hours. Do not drink alcohol. Do not take sleeping pills or other medications that may interfere with treatment.  Continue all activities unless the activities cause more pain. When the pain lessens, slowly resume normal activities. Gradually increase the intensity and duration of the activities or exercise.  During periods of severe pain, bed rest may be helpful. Lay or sit in any position that is comfortable.  Putting ice on the injured area.  Put ice in a bag.  Place a towel between your skin and the bag.  Leave the ice on for 15  to 20 minutes, 3 to 4 times a day.  Follow up with your caregiver for continued problems and no  reason can be found for the pain. If the pain becomes worse or does not go away, it may be necessary to repeat tests or do additional testing. Your caregiver may need to look further for a possible cause. SEEK IMMEDIATE MEDICAL CARE IF:  You have pain that is getting worse and is not relieved by medications.  You develop chest pain that is associated with shortness or breath, sweating, feeling sick to your stomach (nauseous), or throw up (vomit).  Your pain becomes localized to the abdomen.  You develop any new symptoms that seem different or that concern you. MAKE SURE YOU:   Understand these instructions.  Will watch your condition.  Will get help right away if you are not doing well or get worse.   This information is not intended to replace advice given to you by your health care provider. Make sure you discuss any questions you have with your health care provider.   Document Released: 08/16/2005 Document Revised: 11/08/2011 Document Reviewed: 04/20/2013 Elsevier Interactive Patient Education [employer].

## 2015-07-04 NOTE — ED Notes (Signed)
Pt ambulated with the a walker for 30 feet.  Pt maintained a steady gait.  Showed no signs of distress.

## 2015-09-16 ENCOUNTER — Other Ambulatory Visit: Payer: Self-pay | Admitting: Internal Medicine

## 2015-09-16 DIAGNOSIS — R109 Unspecified abdominal pain: Secondary | ICD-10-CM

## 2015-09-23 ENCOUNTER — Ambulatory Visit
Admission: RE | Admit: 2015-09-23 | Discharge: 2015-09-23 | Disposition: A | Discharge status: Home / Self Care | Payer: Medicare Other | Source: Ambulatory Visit | Attending: Internal Medicine | Admitting: Internal Medicine

## 2015-09-23 DIAGNOSIS — R109 Unspecified abdominal pain: Secondary | ICD-10-CM

## 2015-09-24 ENCOUNTER — Other Ambulatory Visit: Payer: Self-pay | Admitting: Internal Medicine

## 2015-09-24 DIAGNOSIS — R399 Unspecified symptoms and signs involving the genitourinary system: Secondary | ICD-10-CM

## 2015-09-29 ENCOUNTER — Emergency Department (HOSPITAL_COMMUNITY): Payer: Medicare Other

## 2015-09-29 ENCOUNTER — Encounter (HOSPITAL_COMMUNITY): Payer: Self-pay | Admitting: Emergency Medicine

## 2015-09-29 ENCOUNTER — Emergency Department (HOSPITAL_COMMUNITY)
Admission: EM | Admit: 2015-09-29 | Discharge: 2015-09-29 | Disposition: A | Discharge status: Home / Self Care | Payer: Medicare Other | Attending: Emergency Medicine | Admitting: Emergency Medicine

## 2015-09-29 DIAGNOSIS — Y9289 Other specified places as the place of occurrence of the external cause: Secondary | ICD-10-CM | POA: Insufficient documentation

## 2015-09-29 DIAGNOSIS — Z79899 Other long term (current) drug therapy: Secondary | ICD-10-CM | POA: Diagnosis not present

## 2015-09-29 DIAGNOSIS — F419 Anxiety disorder, unspecified: Secondary | ICD-10-CM | POA: Insufficient documentation

## 2015-09-29 DIAGNOSIS — Z853 Personal history of malignant neoplasm of breast: Secondary | ICD-10-CM | POA: Insufficient documentation

## 2015-09-29 DIAGNOSIS — Z7982 Long term (current) use of aspirin: Secondary | ICD-10-CM | POA: Insufficient documentation

## 2015-09-29 DIAGNOSIS — Y9389 Activity, other specified: Secondary | ICD-10-CM | POA: Insufficient documentation

## 2015-09-29 DIAGNOSIS — Y998 Other external cause status: Secondary | ICD-10-CM | POA: Diagnosis not present

## 2015-09-29 DIAGNOSIS — W1839XA Other fall on same level, initial encounter: Secondary | ICD-10-CM | POA: Diagnosis not present

## 2015-09-29 DIAGNOSIS — S60211A Contusion of right wrist, initial encounter: Secondary | ICD-10-CM | POA: Diagnosis not present

## 2015-09-29 DIAGNOSIS — Z8543 Personal history of malignant neoplasm of ovary: Secondary | ICD-10-CM | POA: Insufficient documentation

## 2015-09-29 DIAGNOSIS — Z8673 Personal history of transient ischemic attack (TIA), and cerebral infarction without residual deficits: Secondary | ICD-10-CM | POA: Diagnosis not present

## 2015-09-29 DIAGNOSIS — S6991XA Unspecified injury of right wrist, hand and finger(s), initial encounter: Secondary | ICD-10-CM | POA: Diagnosis present

## 2015-09-29 NOTE — ED Provider Notes (Signed)
CSN: [id number]     Arrival date & time 09/29/15  1718 History   First MD Initiated Contact with Patient 09/29/15 2039     Chief Complaint  Patient presents with  . Arm Pain     (Consider location/radiation/quality/duration/timing/severity/associated sxs/prior Treatment) Patient is a 82 y.o. female presenting with arm pain. The history is provided by the patient. No language interpreter was used.  Arm Pain This is a new problem. The current episode started today. The problem occurs constantly. The problem has been unchanged. Nothing aggravates the symptoms. She has tried nothing for the symptoms. The treatment provided moderate relief.  Pt complains of bruising to her right arm.  Pt reports she tripped on [place] did not think she had any injuries  Past Medical History  Diagnosis Date  . Stroke ([place])   . Anxiety   . Cancer [facility])     history of breast cancer  . Ovarian cancer [facility])    Past Surgical History  Procedure Laterality Date  . Breast surgery      partial right breast   . Appendectomy    . Tonsillectomy     History reviewed. No pertinent family history. Social History  Substance Use Topics  . Smoking status: Never Smoker   . Smokeless tobacco: Never Used  . Alcohol Use: 0.6 oz/week    1 Glasses of wine per week     Comment: rarely   OB History    No data available     Review of Systems  All other systems reviewed and are negative.     Allergies  Review of patient's allergies indicates no known allergies.  Home Medications   Prior to Admission medications   Medication Sig Start Date End Date Taking? Authorizing Provider  amLODipine (NORVASC) 5 MG tablet Take 1 tablet (5 mg total) by mouth daily. 05/01/15  Yes Shanker Kristeen Mans, MD  aspirin EC 81 MG tablet Take 81 mg by mouth every evening.    Yes Historical Provider, MD  Cholecalciferol (VITAMIN D) 2000 UNITS tablet Take 2,000 Units by mouth daily.   Yes Historical Provider, MD  lovastatin (MEVACOR)  20 MG tablet Take 20 mg by mouth every evening.  03/07/15  Yes Historical Provider, MD  sertraline (ZOLOFT) 50 MG tablet Take 50 mg by mouth daily. 03/07/15  Yes Historical Provider, MD  traMADol (ULTRAM) 50 MG tablet Take 50 mg by mouth every 6 (six) hours as needed for moderate pain.   Yes Historical Provider, MD  vitamin B-12 (CYANOCOBALAMIN) 250 MCG tablet Take 250 mcg by mouth daily.   Yes Historical Provider, MD  oxyCODONE-acetaminophen (PERCOCET/ROXICET) 5-325 MG tablet Take 1 tablet by mouth every 6 (six) hours as needed for severe pain. Patient not taking: Reported on 09/29/2015 07/04/15   Stevi Barrett, PA-C   BP 157/79 mmHg  Pulse 66  Temp(Src) 99.2 F (37.3 C) (Oral)  Resp 18  SpO2 98% Physical Exam  Constitutional: She is oriented to person, place, and time. She appears well-developed and well-nourished.  HENT:  Head: Normocephalic.  Eyes: EOM are normal.  Neck: Normal range of motion.  Pulmonary/Chest: Effort normal.  Abdominal: She exhibits no distension.  Musculoskeletal: Normal range of motion. She exhibits tenderness.  10cm bruised area right wrist palmar aspect.  No erythema not ot to touch  Neurological: She is alert and oriented to person, place, and time.  Psychiatric: She has a normal mood and affect.  Nursing note and vitals reviewed.   ED Course  Procedures (  including critical care time) Labs Review Labs Reviewed - No data to display  Imaging Review Dg Wrist Complete Right  09/29/2015  CLINICAL DATA:  Wrist pain and swelling since falling 3 days ago. EXAM: RIGHT WRIST - COMPLETE 3+ VIEW COMPARISON:  None. FINDINGS: The bones are demineralized. There is no evidence of acute fracture or dislocation. There are degenerative changes throughout the wrist, greatest radially. The ulnar variance is negative. There is mild TFCC chondrocalcinosis. The soft tissues of the wrist appear diffusely prominent and potentially swollen. No foreign bodies identified. IMPRESSION:  Suspected soft tissue swelling and diffuse degenerative changes. No acute osseous findings identified. Electronically Signed   By: Richardean Sale M.D.   On: 09/29/2015 17:51   I have personally reviewed and evaluated these images and lab results as part of my medical decision-making.   EKG Interpretation None      MDM family concerned that pt could have infection.  She recently had a manicure.  (no cuts, no scrapes, this does not look like infection)   Final diagnoses:  Contusion of right wrist, initial encounter        Fransico Meadow, PA-C 09/29/15 2103  Tanna Furry, MD 10/04/15 [phone]

## 2015-09-29 NOTE — Discharge Instructions (Signed)

## 2015-09-29 NOTE — ED Notes (Signed)
Pt with bruising and swelling to right arm after fall on Saturday; pt denies pain; pt denies blood thinners

## 2015-10-01 ENCOUNTER — Ambulatory Visit
Admission: RE | Admit: 2015-10-01 | Discharge: 2015-10-01 | Disposition: A | Discharge status: Home / Self Care | Payer: Medicare Other | Source: Ambulatory Visit | Attending: Internal Medicine | Admitting: Internal Medicine

## 2015-10-01 DIAGNOSIS — R399 Unspecified symptoms and signs involving the genitourinary system: Secondary | ICD-10-CM

## 2015-10-01 MED ORDER — IOPAMIDOL (ISOVUE-300) INJECTION 61%
100.0000 mL | Freq: Once | INTRAVENOUS | Status: AC | PRN
  Administered 2015-10-01: 100 mL via INTRAVENOUS

## 2015-10-21 ENCOUNTER — Other Ambulatory Visit (HOSPITAL_COMMUNITY): Payer: Self-pay | Admitting: Internal Medicine

## 2015-10-21 ENCOUNTER — Other Ambulatory Visit: Payer: Self-pay

## 2015-10-21 ENCOUNTER — Ambulatory Visit (HOSPITAL_COMMUNITY): Payer: Medicare Other | Attending: Cardiology

## 2015-10-21 DIAGNOSIS — I351 Nonrheumatic aortic (valve) insufficiency: Secondary | ICD-10-CM | POA: Diagnosis not present

## 2015-10-21 DIAGNOSIS — I5189 Other ill-defined heart diseases: Secondary | ICD-10-CM | POA: Insufficient documentation

## 2015-10-21 DIAGNOSIS — I071 Rheumatic tricuspid insufficiency: Secondary | ICD-10-CM | POA: Diagnosis not present

## 2015-10-21 DIAGNOSIS — I517 Cardiomegaly: Secondary | ICD-10-CM | POA: Insufficient documentation

## 2015-12-11 ENCOUNTER — Ambulatory Visit (INDEPENDENT_AMBULATORY_CARE_PROVIDER_SITE_OTHER): Payer: Medicare Other | Admitting: Interventional Cardiology

## 2015-12-11 ENCOUNTER — Encounter: Payer: Self-pay | Admitting: Interventional Cardiology

## 2015-12-11 VITALS — BP 156/72 | HR 65 | Ht 62.0 in | Wt 113.4 lb

## 2015-12-11 DIAGNOSIS — I10 Essential (primary) hypertension: Secondary | ICD-10-CM | POA: Diagnosis not present

## 2015-12-11 DIAGNOSIS — I447 Left bundle-branch block, unspecified: Secondary | ICD-10-CM

## 2015-12-11 NOTE — Progress Notes (Signed)
Cardiology Office Note   Date:  12/11/2015   ID:  Priscilla Leon, DOB 1923-12-16, MRN [id number]  PCP:  Lottie Dawson, MD  Cardiologist:  Sinclair Grooms, MD   Chief Complaint  Patient presents with  . Abnormal ECG      History of Present Illness: Priscilla Leon is a 82 y.o. female who presents for Consultation concerning an abnormal echocardiogram.  Patient had echocardiography performed by Dr. Amedeo Kinsman, to evaluate in the setting of left bundle branch block. The patient has no specific complaints other than age-related decrease in exertional tolerance. She is independent and ambulatory. She is referred for cardiac evaluation after the echocardiogram demonstrated low normal function and septal wall motion abnormality.    Past Medical History  Diagnosis Date  . Stroke ([place])   . Anxiety   . Cancer [facility])     history of breast cancer  . Ovarian cancer [facility])     Past Surgical History  Procedure Laterality Date  . Breast surgery      partial right breast   . Appendectomy    . Tonsillectomy       Current Outpatient Prescriptions  Medication Sig Dispense Refill  . ALPRAZolam (XANAX) 0.25 MG tablet Take 0.25 mg by mouth every 6 (six) hours as needed for anxiety.    Marland Kitchen amLODipine (NORVASC) 5 MG tablet Take 1 tablet (5 mg total) by mouth daily. 10 tablet 0  . aspirin EC 81 MG tablet Take 81 mg by mouth every evening.     . Cholecalciferol (VITAMIN D) 2000 UNITS tablet Take 2,000 Units by mouth daily.    Marland Kitchen lovastatin (MEVACOR) 20 MG tablet Take 20 mg by mouth every evening.   3  . sertraline (ZOLOFT) 50 MG tablet Take 50 mg by mouth daily.  6  . vitamin B-12 (CYANOCOBALAMIN) 250 MCG tablet Take 250 mcg by mouth daily.     No current facility-administered medications for this visit.    Allergies:   Review of patient's allergies indicates no known allergies.    Social History:  The patient  reports that she has never smoked. She has never used smokeless  tobacco. She reports that she drinks about 0.6 oz of alcohol per week. She reports that she does not use illicit drugs.   Family History:  The patient's family history includes Heart disease in her brother, brother, brother, father, and mother.    ROS:  Please see the history of present illness.   Otherwise, review of systems are positive for back pain.   All other systems are reviewed and negative.    PHYSICAL EXAM: VS:  BP 156/72 mmHg  Pulse 65  Ht 5\' 2"  (1.575 m)  Wt 113 lb 6.4 oz (51.438 kg)  BMI 20.74 kg/m2 , BMI Body mass index is 20.74 kg/(m^2). GEN: Well nourished, well developed, in no acute distress HEENT: normal Neck: no JVD, carotid bruits, or masses Cardiac: RRR.  There is no murmur, rub, or gallop. There is no edema. Respiratory:  clear to auscultation bilaterally, normal work of breathing. GI: soft, nontender, nondistended, + BS MS: no deformity or atrophy Skin: warm and dry, no rash Neuro:  Strength and sensation are intact Psych: euthymic mood, full affect   EKG:  EKG is ordered today. The ekg reveals sinus rhythm, normal PR interval, left bundle branch block with left axis deviation. QRS duration 134 ms   Recent Labs: 04/29/2015: TSH 2.517 05/01/2015: Magnesium 2.1 06/23/2015: BUN 18; Creatinine, Ser 0.69; Hemoglobin  12.4; Platelets 188; Potassium 4.1; Sodium 143    Lipid Panel    Component Value Date/Time   CHOL  08/25/2009 0543    166        ATP III CLASSIFICATION:  <200     mg/dL   Desirable  200-239  mg/dL   Borderline High  >=240    mg/dL   High          TRIG 94 08/25/2009 0543   HDL 46 08/25/2009 0543   CHOLHDL 3.6 08/25/2009 0543   VLDL 19 08/25/2009 0543   LDLCALC * 08/25/2009 0543    101        Total Cholesterol/HDL:CHD Risk Coronary Heart Disease Risk Table                     Men   Women  1/2 Average Risk   3.4   3.3  Average Risk       5.0   4.4  2 X Average Risk   9.6   7.1  3 X Average Risk  23.4   11.0        Use the calculated  Patient Ratio above and the CHD Risk Table to determine the patient's CHD Risk.        ATP III CLASSIFICATION (LDL):  <100     mg/dL   Optimal  100-129  mg/dL   Near or Above                    Optimal  130-159  mg/dL   Borderline  160-189  mg/dL   High  >190     mg/dL   Very High      Wt Readings from Last 3 Encounters:  12/11/15 113 lb 6.4 oz (51.438 kg)  04/29/15 103 lb 9.9 oz (47 kg)      Other studies Reviewed: Additional studies/ records that were reviewed today include: Reviewed recent echocardiogram. The findings include size and overall function is normal. EF 50-55%. Septal wall motion abnormality secondary left bundle branch block    ASSESSMENT AND PLAN:  1. Essential hypertension Currently controlled on amlodipine  2. Left bundle branch block At her age requires no further evaluation  3. AbnormalEchocardiogram (septal wall motion on echo secondary to left bundle branch block) Recent echo revealed normal LV systolic function but dyssynergy of the septum that in this context is related to left bundle branch block.    Current medicines are reviewed at length with the patient today.  The patient has the following concerns regarding medicines: None.  The following changes/actions have been instituted:    Reassurance  No workup required or follow-up needed  Labs/ tests ordered today include:  No orders of the defined types were placed in this encounter.     Disposition:   FU with HS in PRN    Signed, Sinclair Grooms, MD  12/11/2015 9:37 AM    [address], [place],   [id number] Phone: [phone]; Fax: [phone]

## 2015-12-11 NOTE — Patient Instructions (Signed)
Medication Instructions:  Your physician recommends that you continue on your current medications as directed. Please refer to the Current Medication list given to you today.  Labwork: None ordered.  Testing/Procedures: None ordered.  Follow-Up: Your physician recommends that you schedule a follow-up appointment as needed.   Any Other Special Instructions Will Be Listed Below (If Applicable).     If you need a refill on your cardiac medications before your next appointment, please call your pharmacy.   

## 2016-02-28 ENCOUNTER — Emergency Department (HOSPITAL_COMMUNITY): Payer: Medicare Other

## 2016-02-28 ENCOUNTER — Emergency Department (HOSPITAL_COMMUNITY)
Admission: EM | Admit: 2016-02-28 | Discharge: 2016-02-28 | Disposition: A | Discharge status: Home / Self Care | Payer: Medicare Other | Attending: Emergency Medicine | Admitting: Emergency Medicine

## 2016-02-28 ENCOUNTER — Encounter (HOSPITAL_COMMUNITY): Payer: Self-pay | Admitting: Emergency Medicine

## 2016-02-28 DIAGNOSIS — W0110XA Fall on same level from slipping, tripping and stumbling with subsequent striking against unspecified object, initial encounter: Secondary | ICD-10-CM | POA: Diagnosis not present

## 2016-02-28 DIAGNOSIS — Z853 Personal history of malignant neoplasm of breast: Secondary | ICD-10-CM | POA: Diagnosis not present

## 2016-02-28 DIAGNOSIS — E785 Hyperlipidemia, unspecified: Secondary | ICD-10-CM | POA: Diagnosis not present

## 2016-02-28 DIAGNOSIS — R51 Headache: Secondary | ICD-10-CM | POA: Diagnosis not present

## 2016-02-28 DIAGNOSIS — Y999 Unspecified external cause status: Secondary | ICD-10-CM | POA: Diagnosis not present

## 2016-02-28 DIAGNOSIS — Z8673 Personal history of transient ischemic attack (TIA), and cerebral infarction without residual deficits: Secondary | ICD-10-CM | POA: Insufficient documentation

## 2016-02-28 DIAGNOSIS — Z79899 Other long term (current) drug therapy: Secondary | ICD-10-CM | POA: Insufficient documentation

## 2016-02-28 DIAGNOSIS — Y9302 Activity, running: Secondary | ICD-10-CM | POA: Insufficient documentation

## 2016-02-28 DIAGNOSIS — W19XXXA Unspecified fall, initial encounter: Secondary | ICD-10-CM

## 2016-02-28 DIAGNOSIS — I10 Essential (primary) hypertension: Secondary | ICD-10-CM | POA: Diagnosis not present

## 2016-02-28 DIAGNOSIS — M542 Cervicalgia: Secondary | ICD-10-CM | POA: Diagnosis present

## 2016-02-28 DIAGNOSIS — Y92002 Bathroom of unspecified non-institutional (private) residence single-family (private) house as the place of occurrence of the external cause: Secondary | ICD-10-CM | POA: Diagnosis not present

## 2016-02-28 DIAGNOSIS — Z8543 Personal history of malignant neoplasm of ovary: Secondary | ICD-10-CM | POA: Diagnosis not present

## 2016-02-28 DIAGNOSIS — Z7982 Long term (current) use of aspirin: Secondary | ICD-10-CM | POA: Diagnosis not present

## 2016-02-28 DIAGNOSIS — M549 Dorsalgia, unspecified: Secondary | ICD-10-CM | POA: Diagnosis not present

## 2016-02-28 MED ORDER — ACETAMINOPHEN 325 MG PO TABS
650.0000 mg | ORAL_TABLET | Freq: Once | ORAL | Status: AC
  Administered 2016-02-28: 650 mg via ORAL
  Filled 2016-02-28: qty 2

## 2016-02-28 NOTE — ED Notes (Signed)
Pt ambulates w/ assistance to bathroom w/ no difficulty or c/o pain.

## 2016-02-28 NOTE — ED Notes (Signed)
Pt placed on bedside commode.  

## 2016-02-28 NOTE — ED Notes (Signed)
Pt given phone to place call to "Cindy". Pt left voicemail while RN was in the room.

## 2016-02-28 NOTE — ED Provider Notes (Signed)
CSN: [id number]     Arrival date & time 02/28/16  1428 History   First MD Initiated Contact with Patient 02/28/16 1435     Chief Complaint  Patient presents with  . Fall     (Consider location/radiation/quality/duration/timing/severity/associated sxs/prior Treatment) HPI  82 year old female who presents after mechanical fall. She has a history of hypertension, hyperlipidemia, and takes a baby aspirin daily. States that she has been in her usual state of health and was in an elevator with another woman who with a walker. States that they both attempted to walk off at the same time, she tripped on, initially running forward to prevent from herself from falling. However she lost balance and fell on the back of her head. She didn't have syncope or loss of consciousness. Initially, she did complain of mild neck pain and back pain to EMS, but states that her pain now fully resolved.  Past Medical History  Diagnosis Date  . Stroke ([place])   . Anxiety   . Cancer [facility])     history of breast cancer  . Ovarian cancer [facility])    Past Surgical History  Procedure Laterality Date  . Breast surgery      partial right breast   . Appendectomy    . Tonsillectomy     Family History  Problem Relation Age of Onset  . Heart disease Mother   . Heart disease Father   . Heart disease Brother   . Heart disease Brother   . Heart disease Brother    Social History  Substance Use Topics  . Smoking status: Never Smoker   . Smokeless tobacco: Never Used  . Alcohol Use: 0.6 oz/week    1 Glasses of wine per week     Comment: rarely   OB History    No data available     Review of Systems 10/14 systems reviewed and are negative other than those stated in the HPI   Allergies  Review of patient's allergies indicates no known allergies.  Home Medications   Prior to Admission medications   Medication Sig Start Date End Date Taking? Authorizing Provider  ALPRAZolam (XANAX) 0.25 MG tablet Take 0.25 mg by  mouth every 6 (six) hours as needed for anxiety.   Yes Historical Provider, MD  amLODipine (NORVASC) 5 MG tablet Take 1 tablet (5 mg total) by mouth daily. 05/01/15  Yes Shanker Kristeen Mans, MD  aspirin EC 81 MG tablet Take 81 mg by mouth every evening.    Yes Historical Provider, MD  Cholecalciferol (VITAMIN D) 2000 UNITS tablet Take 2,000 Units by mouth daily.   Yes Historical Provider, MD  lovastatin (MEVACOR) 20 MG tablet Take 20 mg by mouth every evening.  03/07/15  Yes Historical Provider, MD  sertraline (ZOLOFT) 50 MG tablet Take 50 mg by mouth daily. 03/07/15  Yes Historical Provider, MD  vitamin B-12 (CYANOCOBALAMIN) 250 MCG tablet Take 250 mcg by mouth daily.   Yes Historical Provider, MD   BP 157/66 mmHg  Pulse 71  Temp(Src) 98 F (36.7 C) (Oral)  Resp 16  SpO2 93% Physical Exam Physical Exam  Nursing note and vitals reviewed. Constitutional: Well developed, well nourished, non-toxic, and in no acute distress Head: Normocephalic and atraumatic.  Mouth/Throat: Oropharynx is clear and moist.  Neck: Normal range of motion. Neck supple. no c-spine tenderness Cardiovascular: Normal rate and regular rhythm.  minal anterior right upper chest wall tenderness, no crepitus, bruising or deformities Pulmonary/Chest: Effort normal and breath sounds normal.  Abdominal: Soft. There is no tenderness. There is no rebound and no guarding.  Musculoskeletal: Normal range of motion.  Neurological: Alert, no facial droop, fluent speech, moves all extremities symmetrically Skin: Skin is warm and dry.  Psychiatric: Cooperative  ED Course  Procedures (including critical care time) Labs Review Labs Reviewed - No data to display  Imaging Review Dg Chest 2 View  02/28/2016  CLINICAL DATA:  Fall.  Chest pain EXAM: CHEST  2 VIEW COMPARISON:  06/23/2015 FINDINGS: Heart size upper normal. Negative for heart failure. Negative for infiltrate or effusion. Apical scarring bilaterally.  Chronic left upper rib  fractures. IMPRESSION: No active cardiopulmonary disease. Electronically Signed   By: Franchot Gallo M.D.   On: 02/28/2016 15:44   Ct Head Wo Contrast  02/28/2016  CLINICAL DATA:  Fall hitting head over occipital region. Headache and dizziness. Neck and back pain. EXAM: CT HEAD WITHOUT CONTRAST CT CERVICAL SPINE WITHOUT CONTRAST TECHNIQUE: Multidetector CT imaging of the head and cervical spine was performed following the standard protocol without intravenous contrast. Multiplanar CT image reconstructions of the cervical spine were also generated. COMPARISON:  Head CT 07/03/2015 FINDINGS: CT HEAD FINDINGS Ventricles and cisterns within normal. There is mild age related atrophic change. There is an old left parieto-occipital watershed infarct. There is chronic ischemic microvascular disease. Small old lacune infarct over the left lentiform nucleus. No mass, mass effect, shift of midline structures or acute hemorrhage. No evidence of acute infarction. Mild stable irregularity along the left lambdoid suture. Air-fluid level over the left maxillary sinus. CT CERVICAL SPINE FINDINGS Vertebral body alignment, heights and disc space heights are normal. There is mild spondylosis throughout the cervical spine to include the atlantoaxial articulation. There is minimal uncovertebral joint spurring. There is moderate facet arthropathy. Prevertebral soft tissues are normal. No acute fracture or subluxation. Remainder of the is within normal. IMPRESSION: No acute intracranial findings. Old left posterior watershed infarct. Chronic ischemic microvascular disease. Air-fluid level over the left maxillary sinus in which may be due to trauma, acute sinusitis or chronic sedentary state. No acute cervical spine injury. Mild spondylosis throughout the cervical spine. Electronically Signed   By: Marin Olp M.D.   On: 02/28/2016 15:43   Ct Cervical Spine Wo Contrast  02/28/2016  CLINICAL DATA:  Fall hitting head over occipital  region. Headache and dizziness. Neck and back pain. EXAM: CT HEAD WITHOUT CONTRAST CT CERVICAL SPINE WITHOUT CONTRAST TECHNIQUE: Multidetector CT imaging of the head and cervical spine was performed following the standard protocol without intravenous contrast. Multiplanar CT image reconstructions of the cervical spine were also generated. COMPARISON:  Head CT 07/03/2015 FINDINGS: CT HEAD FINDINGS Ventricles and cisterns within normal. There is mild age related atrophic change. There is an old left parieto-occipital watershed infarct. There is chronic ischemic microvascular disease. Small old lacune infarct over the left lentiform nucleus. No mass, mass effect, shift of midline structures or acute hemorrhage. No evidence of acute infarction. Mild stable irregularity along the left lambdoid suture. Air-fluid level over the left maxillary sinus. CT CERVICAL SPINE FINDINGS Vertebral body alignment, heights and disc space heights are normal. There is mild spondylosis throughout the cervical spine to include the atlantoaxial articulation. There is minimal uncovertebral joint spurring. There is moderate facet arthropathy. Prevertebral soft tissues are normal. No acute fracture or subluxation. Remainder of the is within normal. IMPRESSION: No acute intracranial findings. Old left posterior watershed infarct. Chronic ischemic microvascular disease. Air-fluid level over the left maxillary sinus in which may be  due to trauma, acute sinusitis or chronic sedentary state. No acute cervical spine injury. Mild spondylosis throughout the cervical spine. Electronically Signed   By: Marin Olp M.D.   On: 02/28/2016 15:43   I have personally reviewed and evaluated these images and lab results as part of my medical decision-making.   EKG Interpretation None      MDM   Final diagnoses:  Fall, initial encounter   82 year old female who presents after mechanical fall. On presentation she is in a cervical collar. She is  well-appearing, in no acute distress. Vital signs are stable. No significant injury noted on exam but she did have some minimal anterior right chest wall tenderness without crepitus or bruising. Chest x-ray does not show serious traumatic injury. CT of the head and cervical spine does not show any injury of the head and neck. Cervical collar is cleared and she is able to ambulate. I felt appropriate for discharge back to her assisted living facility.  Of note, on reevaluation of this patient, she was very upset that her family did not note that she was here. She states that she had to call her daughter herself and states that this is the worst service that she has ever had. I tried to attempt service recovery, but she states that this is too late. And I offered to call her family members as well to update them, but she refuses. She states she is ready for discharge. Strict return and follow-up instructions reviewed. She expressed understanding of all discharge instructions and felt comfortable with the plan of care.     Forde Dandy, MD 02/28/16 [phone]

## 2016-02-28 NOTE — Discharge Instructions (Signed)
You did not sustain serious injury from your fall.  Return without fail for worsening symptoms, including confusion, escalating pain, inability to walk or any other symptoms concerning to you.

## 2016-02-28 NOTE — ED Notes (Addendum)
BIB EMS from [facility], pt experienced a mechanical fall and hit her posterior head. Pt denies loc. Pt denies HA and dizziness. Pt denied n/v. No hx of blood thinner use. Pt c/o of neck and back pain when EMS arrived on scene, but at this time denies pain. Pt A+OX4, speaking in complete sentences.

## 2016-02-28 NOTE — ED Notes (Signed)
Bed: WA01 Expected date:  Expected time:  Means of arrival:  Comments: fall 

## 2016-03-24 ENCOUNTER — Other Ambulatory Visit: Payer: Self-pay | Admitting: Internal Medicine

## 2016-03-24 ENCOUNTER — Ambulatory Visit
Admission: RE | Admit: 2016-03-24 | Discharge: 2016-03-24 | Disposition: A | Discharge status: Home / Self Care | Payer: Medicare Other | Source: Ambulatory Visit | Attending: Internal Medicine | Admitting: Internal Medicine

## 2016-03-24 DIAGNOSIS — M25552 Pain in left hip: Secondary | ICD-10-CM

## 2017-06-01 ENCOUNTER — Other Ambulatory Visit: Payer: Self-pay | Admitting: Internal Medicine

## 2017-06-01 ENCOUNTER — Ambulatory Visit
Admission: RE | Admit: 2017-06-01 | Discharge: 2017-06-01 | Disposition: A | Discharge status: Home / Self Care | Payer: Medicare Other | Source: Ambulatory Visit | Attending: Internal Medicine | Admitting: Internal Medicine

## 2017-06-01 DIAGNOSIS — M542 Cervicalgia: Secondary | ICD-10-CM

## 2018-03-21 ENCOUNTER — Emergency Department (HOSPITAL_COMMUNITY): Payer: Medicare Other

## 2018-03-21 ENCOUNTER — Emergency Department (HOSPITAL_COMMUNITY)
Admission: EM | Admit: 2018-03-21 | Discharge: 2018-03-22 | Disposition: A | Discharge status: Home / Self Care | Payer: Medicare Other | Attending: Emergency Medicine | Admitting: Emergency Medicine

## 2018-03-21 ENCOUNTER — Encounter (HOSPITAL_COMMUNITY): Payer: Self-pay

## 2018-03-21 DIAGNOSIS — Y9301 Activity, walking, marching and hiking: Secondary | ICD-10-CM | POA: Diagnosis not present

## 2018-03-21 DIAGNOSIS — Z7982 Long term (current) use of aspirin: Secondary | ICD-10-CM | POA: Diagnosis not present

## 2018-03-21 DIAGNOSIS — Y92129 Unspecified place in nursing home as the place of occurrence of the external cause: Secondary | ICD-10-CM | POA: Insufficient documentation

## 2018-03-21 DIAGNOSIS — Z8543 Personal history of malignant neoplasm of ovary: Secondary | ICD-10-CM | POA: Diagnosis not present

## 2018-03-21 DIAGNOSIS — Z853 Personal history of malignant neoplasm of breast: Secondary | ICD-10-CM | POA: Diagnosis not present

## 2018-03-21 DIAGNOSIS — W010XXA Fall on same level from slipping, tripping and stumbling without subsequent striking against object, initial encounter: Secondary | ICD-10-CM | POA: Diagnosis not present

## 2018-03-21 DIAGNOSIS — S42202A Unspecified fracture of upper end of left humerus, initial encounter for closed fracture: Secondary | ICD-10-CM | POA: Insufficient documentation

## 2018-03-21 DIAGNOSIS — I10 Essential (primary) hypertension: Secondary | ICD-10-CM | POA: Diagnosis not present

## 2018-03-21 DIAGNOSIS — Z79899 Other long term (current) drug therapy: Secondary | ICD-10-CM | POA: Diagnosis not present

## 2018-03-21 DIAGNOSIS — S4982XA Other specified injuries of left shoulder and upper arm, initial encounter: Secondary | ICD-10-CM | POA: Diagnosis present

## 2018-03-21 DIAGNOSIS — Y998 Other external cause status: Secondary | ICD-10-CM | POA: Insufficient documentation

## 2018-03-21 NOTE — ED Notes (Signed)
Pt received 125mcg of fentanyl per EMS around 2230

## 2018-03-21 NOTE — ED Notes (Signed)
Bed: QU41 Expected date:  Expected time:  Means of arrival:  Comments: EMS 84 yo female fall-lost balance hit head-no blood thinners-left arm pain/splinted Fentanyl 50 mcg

## 2018-03-21 NOTE — ED Triage Notes (Signed)
Pt from a SNF with a witnessed fall, pt has left arm pain that was splinted by staff

## 2018-03-22 MED ORDER — HYDROCODONE-ACETAMINOPHEN 5-325 MG PO TABS: 1.0000 | ORAL_TABLET | Freq: Four times a day (QID) | ORAL | 0 refills | Status: DC | PRN

## 2018-03-22 MED ORDER — MORPHINE SULFATE (PF) 4 MG/ML IV SOLN
4.0000 mg | Freq: Once | INTRAVENOUS | Status: AC
  Administered 2018-03-22: 4 mg via INTRAVENOUS
  Filled 2018-03-22: qty 1

## 2018-03-22 NOTE — ED Notes (Signed)
Per family, blood pressure should not be taken on right arm.

## 2018-03-22 NOTE — ED Notes (Signed)
Report to [employer] at [facility]

## 2018-03-22 NOTE — ED Provider Notes (Signed)
[facility] DEPT Provider Note   CSN: [id number] Arrival date & time: 03/21/18  2219     History   Chief Complaint Chief Complaint  Patient presents with  . possible broken arm    HPI Priscilla Leon is a 84 y.o. female.  Patient is a 84 year old female with history of anxiety and prior stroke.  She presents today for evaluation of fall.  She was walking at her assisted living facility when she lost her balance and fell down.  She is complaining of pain in her left arm.  Her pain is worse with movement and palpation and there are no alleviating factors.  She does have some bruising to her left lateral supraorbital ridge.  The history is provided by the patient.    Past Medical History:  Diagnosis Date  . Anxiety   . Cancer [facility])    history of breast cancer  . Ovarian cancer ([place])   . Stroke [facility])     Patient Active Problem List   Diagnosis Date Noted  . Peripheral vertigo 04/29/2015  . Hypokalemia 04/29/2015  . HTN (hypertension) 04/29/2015  . Hyperglycemia 04/29/2015  . Dehydration 04/29/2015  . Vertigo 04/29/2015  . Anxiety     Past Surgical History:  Procedure Laterality Date  . APPENDECTOMY    . BREAST SURGERY     partial right breast   . TONSILLECTOMY       OB History   None      Home Medications    Prior to Admission medications   Medication Sig Start Date End Date Taking? Authorizing Provider  ALPRAZolam (XANAX) 0.25 MG tablet Take 0.25 mg by mouth every 6 (six) hours as needed for anxiety.    [provider]  amLODipine (NORVASC) 5 MG tablet Take 1 tablet (5 mg total) by mouth daily. 05/01/15   Ghimire, Henreitta Leber, MD  aspirin EC 81 MG tablet Take 81 mg by mouth every evening.     [provider]  Cholecalciferol (VITAMIN D) 2000 UNITS tablet Take 2,000 Units by mouth daily.    [provider]  lovastatin (MEVACOR) 20 MG tablet Take 20 mg by mouth every evening.  03/07/15   [provider]  sertraline (ZOLOFT) 50 MG tablet Take 50 mg by mouth daily. 03/07/15   [provider]  vitamin B-12 (CYANOCOBALAMIN) 250 MCG tablet Take 250 mcg by mouth daily.    [provider]    Family History Family History  Problem Relation Age of Onset  . Heart disease Mother   . Heart disease Father   . Heart disease Brother   . Heart disease Brother   . Heart disease Brother     Social History Social History   Tobacco Use  . Smoking status: Never Smoker  . Smokeless tobacco: Never Used  Substance Use Topics  . Alcohol use: Yes    Alcohol/week: 0.6 oz    Types: 1 Glasses of wine per week    Comment: rarely  . Drug use: No     Allergies   Patient has no known allergies.   Review of Systems Review of Systems  All other systems reviewed and are negative.    Physical Exam Updated Vital Signs BP (!) 170/72 (BP Location: Right Arm)   Pulse 67   Temp 98.4 F (36.9 C) (Oral)   Resp 20   SpO2 96%   Physical Exam  Constitutional: She is oriented to person, place, and time. She appears well-developed  and well-nourished. No distress.  HENT:  Head: Normocephalic and atraumatic.  Neck: Normal range of motion. Neck supple.  Cardiovascular: Normal rate and regular rhythm. Exam reveals no gallop and no friction rub.  No murmur heard. Pulmonary/Chest: Effort normal and breath sounds normal. No respiratory distress. She has no wheezes.  Abdominal: Soft. Bowel sounds are normal. She exhibits no distension. There is no tenderness.  Musculoskeletal: Normal range of motion.  There is tenderness to palpation in the left deltoid region.  There is no obvious deformity.  Ulnar and radial pulses are palpable.  She is able to move all fingers and sensation is intact throughout the entire hand.  Neurological: She is alert and oriented to person, place, and time.  Skin: Skin is warm and dry. She is not diaphoretic.  Nursing note and vitals reviewed.    ED  Treatments / Results  Labs (all labs ordered are listed, but only abnormal results are displayed) Labs Reviewed - No data to display  EKG None  Radiology Dg Humerus Left  Result Date: 03/21/2018 CLINICAL DATA:  Fall with arm pain EXAM: LEFT HUMERUS - 2+ VIEW COMPARISON:  None. FINDINGS: Limited evaluation of the elbow due to positioning. Acute fracture at the left humeral neck with mild displacement. No humeral head dislocation. No significant angulation. IMPRESSION: Acute mildly displaced fracture involving the left humeral neck. Elbow poorly evaluated due to positioning. Electronically Signed   By: Donavan Foil M.D.   On: 03/21/2018 23:54    Procedures Procedures (including critical care time)  Medications Ordered in ED Medications  morphine 4 MG/ML injection 4 mg (has no administration in time range)     Initial Impression / Assessment and Plan / ED Course  I have reviewed the triage vital signs and the nursing notes.  Pertinent labs & imaging results that were available during my care of the patient were reviewed by me and considered in my medical decision making (see chart for details).  X-rays show a proximal humerus fracture.  She will be placed in a sling and swath and discharged with pain medicine and orthopedic follow-up.  She does have a slight contusion adjacent to the left supraorbital ridge, however no evidence for orbital fracture or entrapment.  The bruising is minimal and I see no indication for imaging.  Final Clinical Impressions(s) / ED Diagnoses   Final diagnoses:  None    ED Discharge Orders    None       Veryl Speak, MD 03/22/18 [phone]

## 2018-03-22 NOTE — Discharge Instructions (Addendum)
Wear sling and swath until followed up by orthopedics.  Follow-up with [facility] orthopedics in 1 week.  Call tomorrow to make these arrangements.  The contact information has been provided in this discharge summary.  Hydrocodone as prescribed as needed for pain.  Ice for 20 minutes every 2 hours while awake for the next 2 days.

## 2018-03-22 NOTE — ED Notes (Signed)
[facility] here for transportation. 

## 2019-01-05 ENCOUNTER — Emergency Department (HOSPITAL_COMMUNITY): Payer: Medicare Other

## 2019-01-05 ENCOUNTER — Emergency Department (HOSPITAL_COMMUNITY)
Admission: EM | Admit: 2019-01-05 | Discharge: 2019-01-05 | Disposition: A | Discharge status: Home / Self Care | Payer: Medicare Other | Attending: Emergency Medicine | Admitting: Emergency Medicine

## 2019-01-05 ENCOUNTER — Other Ambulatory Visit: Payer: Self-pay

## 2019-01-05 ENCOUNTER — Encounter (HOSPITAL_COMMUNITY): Payer: Self-pay

## 2019-01-05 DIAGNOSIS — F0390 Unspecified dementia without behavioral disturbance: Secondary | ICD-10-CM | POA: Diagnosis not present

## 2019-01-05 DIAGNOSIS — Y999 Unspecified external cause status: Secondary | ICD-10-CM | POA: Insufficient documentation

## 2019-01-05 DIAGNOSIS — Z79899 Other long term (current) drug therapy: Secondary | ICD-10-CM | POA: Diagnosis not present

## 2019-01-05 DIAGNOSIS — Z7982 Long term (current) use of aspirin: Secondary | ICD-10-CM | POA: Insufficient documentation

## 2019-01-05 DIAGNOSIS — Y92129 Unspecified place in nursing home as the place of occurrence of the external cause: Secondary | ICD-10-CM | POA: Diagnosis not present

## 2019-01-05 DIAGNOSIS — Z8673 Personal history of transient ischemic attack (TIA), and cerebral infarction without residual deficits: Secondary | ICD-10-CM | POA: Insufficient documentation

## 2019-01-05 DIAGNOSIS — I10 Essential (primary) hypertension: Secondary | ICD-10-CM | POA: Diagnosis not present

## 2019-01-05 DIAGNOSIS — S4991XA Unspecified injury of right shoulder and upper arm, initial encounter: Secondary | ICD-10-CM | POA: Diagnosis not present

## 2019-01-05 DIAGNOSIS — W1830XA Fall on same level, unspecified, initial encounter: Secondary | ICD-10-CM | POA: Insufficient documentation

## 2019-01-05 DIAGNOSIS — W19XXXA Unspecified fall, initial encounter: Secondary | ICD-10-CM

## 2019-01-05 DIAGNOSIS — Y939 Activity, unspecified: Secondary | ICD-10-CM | POA: Insufficient documentation

## 2019-01-05 NOTE — ED Triage Notes (Addendum)
Per EMS, patient coming from SNF with complaints of shoulder pain after an unwitnessed fall. At midnight, patient was sitting in chair and a few hours later, she was found in the floor with complaints of pain. No obvious injuries noted.   Patient is pleasantly confused, but facility states that this is normal for her.

## 2019-01-05 NOTE — ED Notes (Addendum)
Patient transported to facility via [facility]. Attempted to call report at facility but received no answer. Patient unable to sign for discharge; verbalized permission for nurse to sign for discharge.

## 2019-01-05 NOTE — ED Notes (Signed)
Paperwork printed for [employer] and placed at nursing station.

## 2019-01-05 NOTE — ED Notes (Signed)
[facility] called for transportation back to [facility]

## 2019-01-05 NOTE — ED Provider Notes (Signed)
[facility] DEPT Provider Note   CSN: [id number] Arrival date & time: 01/05/19  0256    History   Chief Complaint Chief Complaint  Patient presents with  . Fall  . Shoulder Pain    HPI Priscilla Leon is a 85 y.o. female.   The history is provided by the nursing home. The history is limited by the condition of the patient (Dementia).  She has history of ovarian cancer, stroke, hypertension and had an unwitnessed fall at the nursing home where she resides.  She has no memory of the incident.  She has no complaints on my questioning her, but apparently had been complaining of shoulder pain at the nursing facility.  It is not clear which shoulder was involved.  Past Medical History:  Diagnosis Date  . Anxiety   . Cancer [facility])    history of breast cancer  . Ovarian cancer ([place])   . Stroke [facility])     Patient Active Problem List   Diagnosis Date Noted  . Peripheral vertigo 04/29/2015  . Hypokalemia 04/29/2015  . HTN (hypertension) 04/29/2015  . Hyperglycemia 04/29/2015  . Dehydration 04/29/2015  . Vertigo 04/29/2015  . Anxiety     Past Surgical History:  Procedure Laterality Date  . APPENDECTOMY    . BREAST SURGERY     partial right breast   . TONSILLECTOMY       OB History   No obstetric history on file.      Home Medications    Prior to Admission medications   Medication Sig Start Date End Date Taking? Authorizing Provider  ALPRAZolam (XANAX) 0.25 MG tablet Take 0.25 mg by mouth every 6 (six) hours as needed for anxiety.    [provider]  amLODipine (NORVASC) 5 MG tablet Take 1 tablet (5 mg total) by mouth daily. 05/01/15   Ghimire, Henreitta Leber, MD  aspirin EC 81 MG tablet Take 81 mg by mouth every evening.     [provider]  Cholecalciferol (VITAMIN D) 2000 UNITS tablet Take 2,000 Units by mouth daily.    [provider]  HYDROcodone-acetaminophen (NORCO) 5-325 MG tablet Take 1-2 tablets by mouth every  6 (six) hours as needed. 03/22/18   Veryl Speak, MD  lovastatin (MEVACOR) 20 MG tablet Take 20 mg by mouth every evening.  03/07/15   [provider]  sertraline (ZOLOFT) 50 MG tablet Take 50 mg by mouth daily. 03/07/15   [provider]  vitamin B-12 (CYANOCOBALAMIN) 250 MCG tablet Take 250 mcg by mouth daily.    [provider]    Family History Family History  Problem Relation Age of Onset  . Heart disease Mother   . Heart disease Father   . Heart disease Brother   . Heart disease Brother   . Heart disease Brother     Social History Social History   Tobacco Use  . Smoking status: Never Smoker  . Smokeless tobacco: Never Used  Substance Use Topics  . Alcohol use: Yes    Alcohol/week: 1.0 standard drinks    Types: 1 Glasses of wine per week    Comment: rarely  . Drug use: No     Allergies   Patient has no known allergies.   Review of Systems Review of Systems  Unable to perform ROS: Dementia     Physical Exam Updated Vital Signs BP (!) 185/68 (BP Location: Left Arm)   Pulse 64   Temp 99.1 F (37.3 C) (Oral)  Resp 18   SpO2 99%   Physical Exam Vitals signs and nursing note reviewed.    85 year old female, resting comfortably and in no acute distress. Vital signs are significant for elevated blood pressure. Oxygen saturation is 98%, which is normal. Head is normocephalic and atraumatic. PERRLA, EOMI. Oropharynx is clear. Neck is nontender without adenopathy or JVD. Back is nontender and there is no CVA tenderness. Lungs are clear without rales, wheezes, or rhonchi. Chest is nontender. Heart has regular rate and rhythm without murmur. Abdomen is soft, flat, nontender without masses or hepatosplenomegaly and peristalsis is normoactive. Pelvis is stable and nontender. Extremities have no cyanosis or edema, full range of motion is present in all joints. Skin is warm and dry without rash. Neurologic: Awake and oriented to person but  not place or time, cranial nerves are intact, there are no motor or sensory deficits.  ED Treatments / Results   Radiology Dg Shoulder Right  Result Date: 01/05/2019 CLINICAL DATA:  85 y/o F; unwitnessed fall with shoulder tenderness. EXAM: RIGHT SHOULDER - 2+ VIEW COMPARISON:  None. FINDINGS: There is no evidence of fracture or dislocation. There is no evidence of arthropathy or other focal bone abnormality. Bones are demineralized. IMPRESSION: Negative. Electronically Signed   By: Kristine Garbe M.D.   On: 01/05/2019 03:43   Ct Head Wo Contrast  Result Date: 01/05/2019 CLINICAL DATA:  Blunt trauma to the face. EXAM: CT HEAD WITHOUT CONTRAST CT CERVICAL SPINE WITHOUT CONTRAST TECHNIQUE: Multidetector CT imaging of the head and cervical spine was performed following the standard protocol without intravenous contrast. Multiplanar CT image reconstructions of the cervical spine were also generated. COMPARISON:  02/28/2016 FINDINGS: CT HEAD FINDINGS Brain: No evidence of acute infarction, hemorrhage, hydrocephalus, extra-axial collection or mass lesion/mass effect. Remote left occipital infarct that is moderate size. Generalized volume loss. Vascular: No hyperdense vessel or unexpected calcification. Skull: Negative for acute fracture Sinuses/Orbits: No evidence of injury. Staphyloma more noticeable on the left. CT CERVICAL SPINE FINDINGS Alignment: No traumatic malalignment Skull base and vertebrae: Negative for acute fracture. Soft tissues and spinal canal: No prevertebral fluid or swelling. No visible canal hematoma. Disc levels: Overall mild degenerative changes for age. Negative for impingement. Upper chest: Negative IMPRESSION: 1. No evidence of acute intracranial or cervical spine injury. 2. Remote left occipital infarct. Electronically Signed   By: Monte Fantasia M.D.   On: 01/05/2019 04:16   Ct Cervical Spine Wo Contrast  Result Date: 01/05/2019 CLINICAL DATA:  Blunt trauma to the face.  EXAM: CT HEAD WITHOUT CONTRAST CT CERVICAL SPINE WITHOUT CONTRAST TECHNIQUE: Multidetector CT imaging of the head and cervical spine was performed following the standard protocol without intravenous contrast. Multiplanar CT image reconstructions of the cervical spine were also generated. COMPARISON:  02/28/2016 FINDINGS: CT HEAD FINDINGS Brain: No evidence of acute infarction, hemorrhage, hydrocephalus, extra-axial collection or mass lesion/mass effect. Remote left occipital infarct that is moderate size. Generalized volume loss. Vascular: No hyperdense vessel or unexpected calcification. Skull: Negative for acute fracture Sinuses/Orbits: No evidence of injury. Staphyloma more noticeable on the left. CT CERVICAL SPINE FINDINGS Alignment: No traumatic malalignment Skull base and vertebrae: Negative for acute fracture. Soft tissues and spinal canal: No prevertebral fluid or swelling. No visible canal hematoma. Disc levels: Overall mild degenerative changes for age. Negative for impingement. Upper chest: Negative IMPRESSION: 1. No evidence of acute intracranial or cervical spine injury. 2. Remote left occipital infarct. Electronically Signed   By: Neva Seat.D.  On: 01/05/2019 04:16   Dg Shoulder Left  Result Date: 01/05/2019 CLINICAL DATA:  Unwitnessed fall. EXAM: LEFT SHOULDER - 2+ VIEW COMPARISON:  03/21/2018 FINDINGS: Evidence of old healed humeral neck fracture. No acute fracture, subluxation or dislocation. Mild degenerative changes in the left AC and glenohumeral joints. Old left rib fractures noted. IMPRESSION: Old healed left humeral neck fracture and left rib fractures. No acute bony abnormality. Electronically Signed   By: Rolm Baptise M.D.   On: 01/05/2019 03:46    Procedures Procedures  Medications Ordered in ED Medications - No data to display   Initial Impression / Assessment and Plan / ED Course  I have reviewed the triage vital signs and the nursing notes.  Pertinent labs &  imaging results that were available during my care of the patient were reviewed by me and considered in my medical decision making (see chart for details).  Status post unwitnessed fall at nursing facility.  No obvious injury on exam.  With report of shoulder pain, will send for bilateral shoulder x-rays.  We will also check CT of head and cervical spine.  Old records are reviewed, and she has several other ED visits for falls including a fall with proximal humerus fracture last July.  CT and x-rays showed no acute injury.  She is discharged back to her skilled nursing facility.  Final Clinical Impressions(s) / ED Diagnoses   Final diagnoses:  Fall at nursing home, initial encounter  Elevated blood pressure reading with diagnosis of hypertension    ED Discharge Orders    None       Delora Fuel, MD 12/87/86 [phone]

## 2019-01-05 NOTE — ED Notes (Signed)
Bed: MB84 Expected date:  Expected time:  Means of arrival:  Comments: 85 yr old fall, shoulder pain

## 2019-01-05 NOTE — ED Notes (Signed)
Patient transported to x-ray. ?

## 2019-03-12 ENCOUNTER — Emergency Department (HOSPITAL_COMMUNITY): Payer: Medicare Other

## 2019-03-12 ENCOUNTER — Encounter (HOSPITAL_COMMUNITY): Payer: Self-pay | Admitting: Emergency Medicine

## 2019-03-12 ENCOUNTER — Emergency Department (HOSPITAL_COMMUNITY)
Admission: EM | Admit: 2019-03-12 | Discharge: 2019-03-13 | Disposition: A | Discharge status: Home / Self Care | Payer: Medicare Other | Attending: Emergency Medicine | Admitting: Emergency Medicine

## 2019-03-12 ENCOUNTER — Other Ambulatory Visit: Payer: Self-pay

## 2019-03-12 DIAGNOSIS — Z853 Personal history of malignant neoplasm of breast: Secondary | ICD-10-CM | POA: Diagnosis not present

## 2019-03-12 DIAGNOSIS — Z8543 Personal history of malignant neoplasm of ovary: Secondary | ICD-10-CM | POA: Diagnosis not present

## 2019-03-12 DIAGNOSIS — I10 Essential (primary) hypertension: Secondary | ICD-10-CM | POA: Diagnosis not present

## 2019-03-12 DIAGNOSIS — W1830XA Fall on same level, unspecified, initial encounter: Secondary | ICD-10-CM | POA: Insufficient documentation

## 2019-03-12 DIAGNOSIS — R51 Headache: Secondary | ICD-10-CM | POA: Diagnosis present

## 2019-03-12 DIAGNOSIS — Y939 Activity, unspecified: Secondary | ICD-10-CM | POA: Insufficient documentation

## 2019-03-12 DIAGNOSIS — Z79899 Other long term (current) drug therapy: Secondary | ICD-10-CM | POA: Diagnosis not present

## 2019-03-12 DIAGNOSIS — Z7982 Long term (current) use of aspirin: Secondary | ICD-10-CM | POA: Diagnosis not present

## 2019-03-12 DIAGNOSIS — Y999 Unspecified external cause status: Secondary | ICD-10-CM | POA: Insufficient documentation

## 2019-03-12 DIAGNOSIS — Z8673 Personal history of transient ischemic attack (TIA), and cerebral infarction without residual deficits: Secondary | ICD-10-CM | POA: Insufficient documentation

## 2019-03-12 DIAGNOSIS — F0390 Unspecified dementia without behavioral disturbance: Secondary | ICD-10-CM | POA: Diagnosis not present

## 2019-03-12 DIAGNOSIS — Y92129 Unspecified place in nursing home as the place of occurrence of the external cause: Secondary | ICD-10-CM | POA: Insufficient documentation

## 2019-03-12 DIAGNOSIS — W19XXXA Unspecified fall, initial encounter: Secondary | ICD-10-CM

## 2019-03-12 NOTE — ED Provider Notes (Signed)
[facility] DEPT Provider Note   CSN: [id number] Arrival date & time: 03/12/19  2135     History   Chief Complaint Chief Complaint  Patient presents with   Fall    HPI Priscilla Leon is a 85 y.o. female.     Patient presents to the emergency department with a chief complaint of fall.  She has a history of dementia.  Level 5 caveat applies.  Reportedly patient had an unwitnessed fall and was complaining of headache.  She denies any pain now.  The history is provided by the patient. No language interpreter was used.    Past Medical History:  Diagnosis Date   Anxiety    Cancer [facility])    history of breast cancer   Ovarian cancer ([place])    Stroke [facility])     Patient Active Problem List   Diagnosis Date Noted   Peripheral vertigo 04/29/2015   Hypokalemia 04/29/2015   HTN (hypertension) 04/29/2015   Hyperglycemia 04/29/2015   Dehydration 04/29/2015   Vertigo 04/29/2015   Anxiety     Past Surgical History:  Procedure Laterality Date   APPENDECTOMY     BREAST SURGERY     partial right breast    TONSILLECTOMY       OB History   No obstetric history on file.      Home Medications    Prior to Admission medications   Medication Sig Start Date End Date Taking? Authorizing Provider  ALPRAZolam (XANAX) 0.25 MG tablet Take 0.25 mg by mouth every 6 (six) hours as needed for anxiety.    [provider]  amLODipine (NORVASC) 5 MG tablet Take 1 tablet (5 mg total) by mouth daily. 05/01/15   Ghimire, Henreitta Leber, MD  aspirin EC 81 MG tablet Take 81 mg by mouth every evening.     [provider]  Cholecalciferol (VITAMIN D) 2000 UNITS tablet Take 2,000 Units by mouth daily.    [provider]  HYDROcodone-acetaminophen (NORCO) 5-325 MG tablet Take 1-2 tablets by mouth every 6 (six) hours as needed. 03/22/18   Veryl Speak, MD  lovastatin (MEVACOR) 20 MG tablet Take 20 mg by mouth every evening.  03/07/15    [provider]  sertraline (ZOLOFT) 50 MG tablet Take 50 mg by mouth daily. 03/07/15   [provider]  vitamin B-12 (CYANOCOBALAMIN) 250 MCG tablet Take 250 mcg by mouth daily.    [provider]    Family History Family History  Problem Relation Age of Onset   Heart disease Mother    Heart disease Father    Heart disease Brother    Heart disease Brother    Heart disease Brother     Social History Social History   Tobacco Use   Smoking status: Never Smoker   Smokeless tobacco: Never Used  Substance Use Topics   Alcohol use: Yes    Alcohol/week: 1.0 standard drinks    Types: 1 Glasses of wine per week    Comment: rarely   Drug use: No     Allergies   Patient has no known allergies.   Review of Systems Review of Systems  All other systems reviewed and are negative.    Physical Exam Updated Vital Signs BP (!) 193/75 (BP Location: Right Arm)    Pulse 73    Temp 98.1 F (36.7 C) (Oral)    Resp 18    SpO2 98%   Physical Exam Vitals signs and nursing note reviewed.  Constitutional:      General: She is not in acute distress.    Appearance: She is well-developed.  HENT:     Head: Normocephalic and atraumatic.     Comments: No visible head trauma Eyes:     Conjunctiva/sclera: Conjunctivae normal.  Neck:     Musculoskeletal: Neck supple.  Cardiovascular:     Rate and Rhythm: Normal rate and regular rhythm.     Heart sounds: No murmur.  Pulmonary:     Effort: Pulmonary effort is normal. No respiratory distress.     Breath sounds: Normal breath sounds.  Abdominal:     Palpations: Abdomen is soft.     Tenderness: There is no abdominal tenderness.  Musculoskeletal: Normal range of motion.     Comments: Moves all extremities  Skin:    General: Skin is warm and dry.  Neurological:     Mental Status: She is alert and oriented to person, place, and time.  Psychiatric:        Mood and Affect: Mood normal.        Behavior:  Behavior normal.        Thought Content: Thought content normal.        Judgment: Judgment normal.      ED Treatments / Results  Labs (all labs ordered are listed, but only abnormal results are displayed) Labs Reviewed - No data to display  EKG None  Radiology Ct Head Wo Contrast  Result Date: 03/12/2019 CLINICAL DATA:  Head trauma EXAM: CT HEAD WITHOUT CONTRAST CT CERVICAL SPINE WITHOUT CONTRAST TECHNIQUE: Multidetector CT imaging of the head and cervical spine was performed following the standard protocol without intravenous contrast. Multiplanar CT image reconstructions of the cervical spine were also generated. COMPARISON:  01/05/2019 FINDINGS: CT HEAD FINDINGS Brain: Old left occipital and posterior parietal infarct with cephalo lesion, stable. There is atrophy and chronic small vessel disease changes. No acute intracranial abnormality. Specifically, no hemorrhage, hydrocephalus, mass lesion, acute infarction, or significant intracranial injury. Vascular: No evidence of aneurysm or adenopathy. Skull: No acute calvarial abnormality. Sinuses/Orbits: Visualized paranasal sinuses and mastoids clear. Orbital soft tissues unremarkable. Other: None CT CERVICAL SPINE FINDINGS Alignment: Normal Skull base and vertebrae: No acute fracture. No primary bone lesion or focal pathologic process. Soft tissues and spinal canal: No prevertebral fluid or swelling. No visible canal hematoma. Disc levels: Osteoarthritic changes at the C1-2 articulation with extensive callus around the dens and anterior arch of C1. Diffuse degenerative facet disease bilaterally. Upper chest: Biapical scarring.  No acute findings. Other: Carotid artery calcifications.  No acute findings. IMPRESSION: Old left occipital and posterior parietal infarct with encephalomalacia. Atrophy, chronic microvascular disease. No acute intracranial abnormality. Degenerative changes in the cervical spine. No acute bony abnormality. Electronically  Signed   By: Rolm Baptise M.D.   On: 03/12/2019 23:10   Ct Cervical Spine Wo Contrast  Result Date: 03/12/2019 CLINICAL DATA:  Head trauma EXAM: CT HEAD WITHOUT CONTRAST CT CERVICAL SPINE WITHOUT CONTRAST TECHNIQUE: Multidetector CT imaging of the head and cervical spine was performed following the standard protocol without intravenous contrast. Multiplanar CT image reconstructions of the cervical spine were also generated. COMPARISON:  01/05/2019 FINDINGS: CT HEAD FINDINGS Brain: Old left occipital and posterior parietal infarct with cephalo lesion, stable. There is atrophy and chronic small vessel disease changes. No acute intracranial abnormality. Specifically, no hemorrhage, hydrocephalus, mass lesion, acute infarction, or significant intracranial injury. Vascular: No evidence of aneurysm or adenopathy. Skull: No acute calvarial abnormality. Sinuses/Orbits: Visualized paranasal  sinuses and mastoids clear. Orbital soft tissues unremarkable. Other: None CT CERVICAL SPINE FINDINGS Alignment: Normal Skull base and vertebrae: No acute fracture. No primary bone lesion or focal pathologic process. Soft tissues and spinal canal: No prevertebral fluid or swelling. No visible canal hematoma. Disc levels: Osteoarthritic changes at the C1-2 articulation with extensive callus around the dens and anterior arch of C1. Diffuse degenerative facet disease bilaterally. Upper chest: Biapical scarring.  No acute findings. Other: Carotid artery calcifications.  No acute findings. IMPRESSION: Old left occipital and posterior parietal infarct with encephalomalacia. Atrophy, chronic microvascular disease. No acute intracranial abnormality. Degenerative changes in the cervical spine. No acute bony abnormality. Electronically Signed   By: Rolm Baptise M.D.   On: 03/12/2019 23:10    Procedures Procedures (including critical care time)  Medications Ordered in ED Medications - No data to display   Initial Impression /  Assessment and Plan / ED Course  I have reviewed the triage vital signs and the nursing notes.  Pertinent labs & imaging results that were available during my care of the patient were reviewed by me and considered in my medical decision making (see chart for details).        Patient with unwitnessed fall today.  No complaints now.  Hx of dementia.  Will check CTs head and neck.  No outward signs of trauma.  Anticipate DC home.  Final Clinical Impressions(s) / ED Diagnoses   Final diagnoses:  Fall, initial encounter    ED Discharge Orders    None       Montine Circle, PA-C 03/12/19 2322    Tegeler, Gwenyth Allegra, MD 03/13/19 [phone]

## 2019-03-12 NOTE — ED Notes (Signed)
Called [facility]tar  Paper work complete  Called [facility] to give dc report and return to facility

## 2019-03-12 NOTE — Discharge Instructions (Addendum)
No serious injuries were identified tonight.  Please follow-up with your doctor.

## 2019-03-12 NOTE — ED Triage Notes (Signed)
Pt comes from [facility], facility worried about a unwitnessed fall pt has dementia at baseline. Pt is alert to person and is very hard of hearing and does not have her hearing aids in. Pt was found on floor beside her bed with no visibile injuries, ems placed c- collar on pt and pt has holding her head ems states, c/o of head pain.  V/s on arrival 182/pal, hr 60, rr20, spo2 97, cbg 110, temp 97.9

## 2019-03-13 NOTE — ED Notes (Signed)
daughter called wanted update on mother, mother being transported home via [place]

## 2019-05-13 ENCOUNTER — Emergency Department (HOSPITAL_COMMUNITY): Payer: Medicare Other

## 2019-05-13 ENCOUNTER — Other Ambulatory Visit: Payer: Self-pay

## 2019-05-13 ENCOUNTER — Encounter (HOSPITAL_COMMUNITY): Payer: Self-pay | Admitting: Emergency Medicine

## 2019-05-13 ENCOUNTER — Emergency Department (HOSPITAL_COMMUNITY)
Admission: EM | Admit: 2019-05-13 | Discharge: 2019-05-13 | Disposition: A | Discharge status: Home / Self Care | Payer: Medicare Other | Attending: Emergency Medicine | Admitting: Emergency Medicine

## 2019-05-13 DIAGNOSIS — Z853 Personal history of malignant neoplasm of breast: Secondary | ICD-10-CM | POA: Insufficient documentation

## 2019-05-13 DIAGNOSIS — Z79899 Other long term (current) drug therapy: Secondary | ICD-10-CM | POA: Diagnosis not present

## 2019-05-13 DIAGNOSIS — I10 Essential (primary) hypertension: Secondary | ICD-10-CM | POA: Insufficient documentation

## 2019-05-13 DIAGNOSIS — S0003XA Contusion of scalp, initial encounter: Secondary | ICD-10-CM | POA: Insufficient documentation

## 2019-05-13 DIAGNOSIS — T148XXA Other injury of unspecified body region, initial encounter: Secondary | ICD-10-CM

## 2019-05-13 DIAGNOSIS — Y939 Activity, unspecified: Secondary | ICD-10-CM | POA: Insufficient documentation

## 2019-05-13 DIAGNOSIS — W19XXXA Unspecified fall, initial encounter: Secondary | ICD-10-CM | POA: Insufficient documentation

## 2019-05-13 DIAGNOSIS — Y92122 Bedroom in nursing home as the place of occurrence of the external cause: Secondary | ICD-10-CM | POA: Insufficient documentation

## 2019-05-13 DIAGNOSIS — Y999 Unspecified external cause status: Secondary | ICD-10-CM | POA: Insufficient documentation

## 2019-05-13 DIAGNOSIS — F0390 Unspecified dementia without behavioral disturbance: Secondary | ICD-10-CM | POA: Insufficient documentation

## 2019-05-13 DIAGNOSIS — Z8543 Personal history of malignant neoplasm of ovary: Secondary | ICD-10-CM | POA: Insufficient documentation

## 2019-05-13 DIAGNOSIS — S0990XA Unspecified injury of head, initial encounter: Secondary | ICD-10-CM | POA: Diagnosis present

## 2019-05-13 LAB — BASIC METABOLIC PANEL
Anion gap: 16 — ABNORMAL HIGH (ref 5–15)
BUN: 17 mg/dL (ref 8–23)
CO2: 21 mmol/L — ABNORMAL LOW (ref 22–32)
Calcium: 9.3 mg/dL (ref 8.9–10.3)
Chloride: 103 mmol/L (ref 98–111)
Creatinine, Ser: 0.94 mg/dL (ref 0.44–1.00)
GFR calc Af Amer: >60 mL/min (ref >60)
GFR calc non Af Amer: 52 mL/min — ABNORMAL LOW (ref >60)
Glucose, Bld: 95 mg/dL (ref 70–99)
Potassium: 3.4 mmol/L — ABNORMAL LOW (ref 3.5–5.1)
Sodium: 140 mmol/L (ref 135–145)

## 2019-05-13 LAB — URINALYSIS, ROUTINE W REFLEX MICROSCOPIC
Bilirubin Urine: NEGATIVE
Glucose, UA: NEGATIVE mg/dL
Hgb urine dipstick: NEGATIVE
Ketones, ur: NEGATIVE mg/dL
Leukocytes,Ua: NEGATIVE
Nitrite: NEGATIVE
Protein, ur: NEGATIVE mg/dL
Specific Gravity, Urine: 1.004 — ABNORMAL LOW (ref 1.005–1.030)
pH: 8 (ref 5.0–8.0)

## 2019-05-13 LAB — CBG MONITORING, ED: Glucose-Capillary: 86 mg/dL (ref 70–99)

## 2019-05-13 LAB — CBC WITH DIFFERENTIAL/PLATELET
Abs Immature Granulocytes: 0.03 10*3/uL (ref 0.00–0.07)
Basophils Absolute: 0 10*3/uL (ref 0.0–0.1)
Basophils Relative: 0 %
Eosinophils Absolute: 0.1 10*3/uL (ref 0.0–0.5)
Eosinophils Relative: 1 %
HCT: 40.6 % (ref 36.0–46.0)
Hemoglobin: 13 g/dL (ref 12.0–15.0)
Immature Granulocytes: 1 %
Lymphocytes Relative: 30 %
Lymphs Abs: 1.6 10*3/uL (ref 0.7–4.0)
MCH: 31.9 pg (ref 26.0–34.0)
MCHC: 32 g/dL (ref 30.0–36.0)
MCV: 99.5 fL (ref 80.0–100.0)
Monocytes Absolute: 0.8 10*3/uL (ref 0.1–1.0)
Monocytes Relative: 15 %
Neutro Abs: 2.8 10*3/uL (ref 1.7–7.7)
Neutrophils Relative %: 53 %
Platelets: 166 10*3/uL (ref 150–400)
RBC: 4.08 MIL/uL (ref 3.87–5.11)
RDW: 13.2 % (ref 11.5–15.5)
WBC: 5.3 10*3/uL (ref 4.0–10.5)
nRBC: 0 % (ref 0.0–0.2)

## 2019-05-13 NOTE — ED Notes (Signed)
[facility] assistant  living calling needing an update  [phone] please ask for Davette

## 2019-05-13 NOTE — ED Notes (Signed)
[facility] [phone]

## 2019-05-13 NOTE — ED Provider Notes (Signed)
[facility] EMERGENCY DEPARTMENT Provider Note   CSN: [id number] Arrival date & time: 05/13/19  1824     History   Chief Complaint Chief Complaint  Patient presents with  . Fall  . Altered Mental Status    HPI Priscilla Leon is a 85 y.o. female brought in by EMS for fall at her memory care unit.  Patient has frequent falls.  She was found after fall in her room.  No altered mentation.  She is contusion to the back of the head and is complaining of some buttock pain.  Patient does not take any blood thinner medications.     HPI  Past Medical History:  Diagnosis Date  . Anxiety   . Cancer [facility])    history of breast cancer  . Ovarian cancer ([place])   . Stroke [facility])     Patient Active Problem List   Diagnosis Date Noted  . Peripheral vertigo 04/29/2015  . Hypokalemia 04/29/2015  . HTN (hypertension) 04/29/2015  . Hyperglycemia 04/29/2015  . Dehydration 04/29/2015  . Vertigo 04/29/2015  . Anxiety     Past Surgical History:  Procedure Laterality Date  . APPENDECTOMY    . BREAST SURGERY     partial right breast   . TONSILLECTOMY       OB History   No obstetric history on file.      Home Medications    Prior to Admission medications   Medication Sig Start Date End Date Taking? Authorizing Provider  acetaminophen (TYLENOL) 325 MG tablet Take 650 mg by mouth every 6 (six) hours as needed (for pain or fever).    Yes [provider]  ALPRAZolam (XANAX) 0.25 MG tablet Take 0.25 mg by mouth every 6 (six) hours as needed for anxiety.   Yes [provider]  amLODipine (NORVASC) 5 MG tablet Take 1 tablet (5 mg total) by mouth daily. 05/01/15  Yes Ghimire, Henreitta Leber, MD  Calcium Carbonate Antacid (TUMS ULTRA PO) Take 1 tablet by mouth 2 (two) times daily.   Yes [provider]  Ensure (ENSURE) Take 237 mLs by mouth 3 (three) times daily between meals.   Yes [provider]  lovastatin (MEVACOR) 20 MG tablet Take 20 mg  by mouth every evening.  03/07/15  Yes [provider]  ondansetron (ZOFRAN) 4 MG tablet Take 4 mg by mouth every 8 (eight) hours as needed for nausea or vomiting.   Yes [provider]  sertraline (ZOLOFT) 25 MG tablet Take 50 mg by mouth 2 (two) times a day.   Yes [provider]  HYDROcodone-acetaminophen (NORCO) 5-325 MG tablet Take 1-2 tablets by mouth every 6 (six) hours as needed. Patient not taking: Reported on 05/13/2019 03/22/18   Veryl Speak, MD    Family History Family History  Problem Relation Age of Onset  . Heart disease Mother   . Heart disease Father   . Heart disease Brother   . Heart disease Brother   . Heart disease Brother     Social History Social History   Tobacco Use  . Smoking status: Never Smoker  . Smokeless tobacco: Never Used  Substance Use Topics  . Alcohol use: Yes    Alcohol/week: 1.0 standard drinks    Types: 1 Glasses of wine per week    Comment: rarely  . Drug use: No     Allergies   Patient has no known allergies.   Review of Systems Review of Systems  Ten systems reviewed  and are negative for acute change, except as noted in the HPI.   Physical Exam Updated Vital Signs BP (!) 186/67   Pulse 83   Resp 16   SpO2 100%   Physical Exam Vitals signs and nursing note reviewed.  Constitutional:      General: She is not in acute distress.    Appearance: She is well-developed. She is not diaphoretic.  HENT:     Head: Normocephalic.     Comments: Hematoma- occipital area    Mouth/Throat:     Mouth: Mucous membranes are moist.  Eyes:     General: No scleral icterus.    Extraocular Movements: Extraocular movements intact.     Conjunctiva/sclera: Conjunctivae normal.     Pupils: Pupils are equal, round, and reactive to light.  Neck:     Musculoskeletal: Normal range of motion and neck supple.  Cardiovascular:     Rate and Rhythm: Normal rate and regular rhythm.     Heart sounds: Normal heart sounds. No  murmur. No friction rub. No gallop.   Pulmonary:     Effort: Pulmonary effort is normal. No respiratory distress.     Breath sounds: Normal breath sounds.  Abdominal:     General: Bowel sounds are normal. There is no distension.     Palpations: Abdomen is soft. There is no mass.     Tenderness: There is no abdominal tenderness. There is no guarding.  Musculoskeletal:        General: No swelling, tenderness or deformity.     Comments: Moves extremities without pain or Limited ROM  No bruising to flanks,'No midline tenderness Pelvis stable   Skin:    General: Skin is warm and dry.  Neurological:     Mental Status: She is alert and oriented to person, place, and time.  Psychiatric:        Behavior: Behavior normal.      ED Treatments / Results  Labs (all labs ordered are listed, but only abnormal results are displayed) Labs Reviewed  BASIC METABOLIC PANEL - Abnormal; Notable for the following components:      Result Value   Potassium 3.4 (*)    CO2 21 (*)    GFR calc non Af Amer 52 (*)    Anion gap 16 (*)    All other components within normal limits  URINALYSIS, ROUTINE W REFLEX MICROSCOPIC - Abnormal; Notable for the following components:   Color, Urine STRAW (*)    Specific Gravity, Urine 1.004 (*)    All other components within normal limits  CBC WITH DIFFERENTIAL/PLATELET  CBG MONITORING, ED    EKG EKG Interpretation  Date/Time:  Sunday May 13 2019 18:38:36 EDT Ventricular Rate:  78 PR Interval:    QRS Duration: 143 QT Interval:  494 QTC Calculation: 563 R Axis:   -2 Text Interpretation:  Sinus rhythm Prolonged PR interval Probable left atrial enlargement Left bundle branch block Confirmed by Virgel Manifold [phone]) on 05/13/2019 6:58:19 PM   Radiology No results found.  Procedures Procedures (including critical care time)  Medications Ordered in ED Medications - No data to display   Initial Impression / Assessment and Plan / ED Course  I have  reviewed the triage vital signs and the nursing notes.  Pertinent labs & imaging results that were available during my care of the patient were reviewed by me and considered in my medical decision making (see chart for details).       [id number]- basline dementia VS: BP Marland Kitchen)  186/67   Pulse 83   Resp 16   SpO2 100%  Temperature was not taken for this patient visit however patient was normothermic to touch [id number] is gathered by EMS and EMR. [id number], seizure Labs: I reviewed the labs which show mild hypokalemia UA negative,cbc without abnormality Imaging: I personally reviewed the images and lumbar spine, CT head and C-spine) which show(s) no acute abnormalities EKG: Sinus rhythm and left bundle branch block MDM: Here after mechanical fall, frequent falls at her facility she is in a memory care unit with moderate to advanced Alzheimer's disease.  No evidence of acute of acute injury at this time Patient disposition: Appears appropriate for discharge Patient condition: Stable. The patient appears reasonably screened and/or stabilized for discharge and I doubt any other medical condition or other [facility] requiring further screening, evaluation, or treatment in the ED at this time prior to discharge. I have discussed lab and/or imaging findings with the patient and answered all questions/concerns to the best of my ability. I have discussed return precautions and OP follow up.       Final Clinical Impressions(s) / ED Diagnoses   Final diagnoses:  Fall, initial encounter  Hematoma    ED Discharge Orders    None       Margarita Mail, PA-C 05/16/19 1132    Virgel Manifold, MD 05/21/19 [phone]

## 2019-05-13 NOTE — ED Notes (Signed)
Pt necklaces and medical necklace placed in bag with pt belongings.

## 2019-05-13 NOTE — ED Notes (Signed)
[facility] called for transport.  

## 2019-05-13 NOTE — ED Triage Notes (Signed)
Pt BIB [facility] from [facility]. Per staff pt has history of dementia. Pt was at baseline all day until 1530 pt went to room. Pt found after a fall in her room. Pt altered to baseline, unable to follow command and confused speech. Pt with contusion to back of the head and complaining of buttocks pain. Pt is not on thinners. VSS.

## 2019-05-13 NOTE — ED Notes (Signed)
Patient transported to CT 

## 2019-06-10 ENCOUNTER — Encounter (HOSPITAL_COMMUNITY): Payer: Self-pay | Admitting: Emergency Medicine

## 2019-06-10 ENCOUNTER — Emergency Department (HOSPITAL_COMMUNITY)
Admission: EM | Admit: 2019-06-10 | Discharge: 2019-06-10 | Disposition: A | Discharge status: Home / Self Care | Payer: Medicare Other | Attending: Emergency Medicine | Admitting: Emergency Medicine

## 2019-06-10 ENCOUNTER — Emergency Department (HOSPITAL_COMMUNITY): Payer: Medicare Other

## 2019-06-10 DIAGNOSIS — F0391 Unspecified dementia with behavioral disturbance: Secondary | ICD-10-CM | POA: Diagnosis not present

## 2019-06-10 DIAGNOSIS — Y92128 Other place in nursing home as the place of occurrence of the external cause: Secondary | ICD-10-CM | POA: Diagnosis not present

## 2019-06-10 DIAGNOSIS — W01198A Fall on same level from slipping, tripping and stumbling with subsequent striking against other object, initial encounter: Secondary | ICD-10-CM | POA: Insufficient documentation

## 2019-06-10 DIAGNOSIS — Y9301 Activity, walking, marching and hiking: Secondary | ICD-10-CM | POA: Diagnosis not present

## 2019-06-10 DIAGNOSIS — I10 Essential (primary) hypertension: Secondary | ICD-10-CM | POA: Diagnosis not present

## 2019-06-10 DIAGNOSIS — Z8543 Personal history of malignant neoplasm of ovary: Secondary | ICD-10-CM | POA: Diagnosis not present

## 2019-06-10 DIAGNOSIS — Z853 Personal history of malignant neoplasm of breast: Secondary | ICD-10-CM | POA: Insufficient documentation

## 2019-06-10 DIAGNOSIS — Z79899 Other long term (current) drug therapy: Secondary | ICD-10-CM | POA: Insufficient documentation

## 2019-06-10 DIAGNOSIS — Z8673 Personal history of transient ischemic attack (TIA), and cerebral infarction without residual deficits: Secondary | ICD-10-CM | POA: Insufficient documentation

## 2019-06-10 DIAGNOSIS — F03911 Unspecified dementia, unspecified severity, with agitation: Secondary | ICD-10-CM

## 2019-06-10 DIAGNOSIS — R519 Headache, unspecified: Secondary | ICD-10-CM | POA: Diagnosis present

## 2019-06-10 DIAGNOSIS — Y998 Other external cause status: Secondary | ICD-10-CM | POA: Insufficient documentation

## 2019-06-10 LAB — BASIC METABOLIC PANEL
Anion gap: 11 (ref 5–15)
BUN: 30 mg/dL — ABNORMAL HIGH (ref 8–23)
CO2: 24 mmol/L (ref 22–32)
Calcium: 9.1 mg/dL (ref 8.9–10.3)
Chloride: 106 mmol/L (ref 98–111)
Creatinine, Ser: 1 mg/dL (ref 0.44–1.00)
GFR calc Af Amer: 56 mL/min — ABNORMAL LOW (ref >60)
GFR calc non Af Amer: 49 mL/min — ABNORMAL LOW (ref >60)
Glucose, Bld: 94 mg/dL (ref 70–99)
Potassium: 3.4 mmol/L — ABNORMAL LOW (ref 3.5–5.1)
Sodium: 141 mmol/L (ref 135–145)

## 2019-06-10 LAB — CBC WITH DIFFERENTIAL/PLATELET
Abs Immature Granulocytes: 0.02 10*3/uL (ref 0.00–0.07)
Basophils Absolute: 0 10*3/uL (ref 0.0–0.1)
Basophils Relative: 0 %
Eosinophils Absolute: 0.1 10*3/uL (ref 0.0–0.5)
Eosinophils Relative: 2 %
HCT: 36.4 % (ref 36.0–46.0)
Hemoglobin: 11.8 g/dL — ABNORMAL LOW (ref 12.0–15.0)
Immature Granulocytes: 0 %
Lymphocytes Relative: 22 %
Lymphs Abs: 1.3 10*3/uL (ref 0.7–4.0)
MCH: 32.2 pg (ref 26.0–34.0)
MCHC: 32.4 g/dL (ref 30.0–36.0)
MCV: 99.5 fL (ref 80.0–100.0)
Monocytes Absolute: 0.8 10*3/uL (ref 0.1–1.0)
Monocytes Relative: 14 %
Neutro Abs: 3.6 10*3/uL (ref 1.7–7.7)
Neutrophils Relative %: 62 %
Platelets: 86 10*3/uL — ABNORMAL LOW (ref 150–400)
RBC: 3.66 MIL/uL — ABNORMAL LOW (ref 3.87–5.11)
RDW: 13.3 % (ref 11.5–15.5)
WBC: 5.8 10*3/uL (ref 4.0–10.5)
nRBC: 0 % (ref 0.0–0.2)

## 2019-06-10 LAB — URINALYSIS, ROUTINE W REFLEX MICROSCOPIC
Bacteria, UA: NONE SEEN
Bilirubin Urine: NEGATIVE
Glucose, UA: NEGATIVE mg/dL
Ketones, ur: NEGATIVE mg/dL
Leukocytes,Ua: NEGATIVE
Nitrite: NEGATIVE
Protein, ur: NEGATIVE mg/dL
Specific Gravity, Urine: 1.017 (ref 1.005–1.030)
pH: 6 (ref 5.0–8.0)

## 2019-06-10 MED ORDER — ALPRAZOLAM 0.25 MG PO TABS
0.2500 mg | ORAL_TABLET | Freq: Once | ORAL | Status: AC
  Administered 2019-06-10: 0.25 mg via ORAL
  Filled 2019-06-10: qty 1

## 2019-06-10 NOTE — ED Provider Notes (Signed)
[facility] EMERGENCY DEPARTMENT Provider Note   CSN: [id number] Arrival date & time: 06/10/19  2012     History   Chief Complaint Chief Complaint  Patient presents with  . Fall    HPI Priscilla Leon is a 85 y.o. female who presents with a possible fall. PMH significant for hx of cancer, hx of stroke, dementia, frequent falls. Pt is from [facility] [place]. Her nurse tech states that the patient has been more agitated today than normal. Staff would have to keep redirecting her all day. She is not in the locked unit of the facility but kept going in and out of it. She was given a dose of Xanax to help calm her down. Then she tripped over there feet and bumped her head on the door. Staff noted that she had a knot on her head and was complaining of pain and got kept falling asleep after that so they decided to have her come to the ED to be checked out. The have not noted any fever, cough, vomiting, diarrhea.  LEVEL 5 CAVEAT due to dementia   HPI  Past Medical History:  Diagnosis Date  . Anxiety   . Cancer [facility])    history of breast cancer  . Ovarian cancer ([place])   . Stroke [facility])     Patient Active Problem List   Diagnosis Date Noted  . Peripheral vertigo 04/29/2015  . Hypokalemia 04/29/2015  . HTN (hypertension) 04/29/2015  . Hyperglycemia 04/29/2015  . Dehydration 04/29/2015  . Vertigo 04/29/2015  . Anxiety     Past Surgical History:  Procedure Laterality Date  . APPENDECTOMY    . BREAST SURGERY     partial right breast   . TONSILLECTOMY       OB History   No obstetric history on file.      Home Medications    Prior to Admission medications   Medication Sig Start Date End Date Taking? Authorizing Provider  acetaminophen (TYLENOL) 325 MG tablet Take 650 mg by mouth every 6 (six) hours as needed (for pain or fever).    Yes [provider]  ALPRAZolam (XANAX) 0.25 MG tablet Take 0.25 mg by mouth every 6 (six) hours as  needed for anxiety.   Yes [provider]  amLODipine (NORVASC) 5 MG tablet Take 1 tablet (5 mg total) by mouth daily. 05/01/15  Yes Ghimire, Henreitta Leber, MD  Calcium Carbonate Antacid (TUMS ULTRA PO) Take 1 tablet by mouth 2 (two) times daily.   Yes [provider]  Ensure (ENSURE) Take 237 mLs by mouth 3 (three) times daily between meals.   Yes [provider]  lovastatin (MEVACOR) 20 MG tablet Take 20 mg by mouth every evening.  03/07/15  Yes [provider]  ondansetron (ZOFRAN) 4 MG tablet Take 4 mg by mouth every 8 (eight) hours as needed for nausea or vomiting.   Yes [provider]  sertraline (ZOLOFT) 25 MG tablet Take 50 mg by mouth 2 (two) times a day.   Yes [provider]    Family History Family History  Problem Relation Age of Onset  . Heart disease Mother   . Heart disease Father   . Heart disease Brother   . Heart disease Brother   . Heart disease Brother     Social History Social History   Tobacco Use  . Smoking status: Never Smoker  . Smokeless tobacco: Never Used  Substance Use Topics  .  Alcohol use: Yes    Alcohol/week: 1.0 standard drinks    Types: 1 Glasses of wine per week    Comment: rarely  . Drug use: No     Allergies   Patient has no known allergies.   Review of Systems Review of Systems  Unable to perform ROS: Dementia     Physical Exam Updated Vital Signs BP (!) 146/78   Pulse (!) 59   Temp 98.7 F (37.1 C) (Oral)   Resp 18   SpO2 96%   Physical Exam Vitals signs and nursing note reviewed.  Constitutional:      General: She is not in acute distress.    Appearance: Normal appearance. She is well-developed. She is not ill-appearing.     Comments: Elderly confused female in NAD. Demanding to go to the bathroom  HENT:     Head: Normocephalic and atraumatic.  Eyes:     General: No scleral icterus.       Right eye: No discharge.        Left eye: No discharge.      Conjunctiva/sclera: Conjunctivae normal.     Pupils: Pupils are equal, round, and reactive to light.  Neck:     Musculoskeletal: Normal range of motion.  Cardiovascular:     Rate and Rhythm: Normal rate and regular rhythm.  Pulmonary:     Effort: Pulmonary effort is normal. No respiratory distress.     Breath sounds: Normal breath sounds.  Abdominal:     General: There is no distension.     Palpations: Abdomen is soft.     Tenderness: There is no abdominal tenderness.  Skin:    General: Skin is warm and dry.  Neurological:     Mental Status: She is alert and oriented to person, place, and time.  Psychiatric:        Behavior: Behavior normal.      ED Treatments / Results  Labs (all labs ordered are listed, but only abnormal results are displayed) Labs Reviewed  BASIC METABOLIC PANEL - Abnormal; Notable for the following components:      Result Value   Potassium 3.4 (*)    BUN 30 (*)    GFR calc non Af Amer 49 (*)    GFR calc Af Amer 56 (*)    All other components within normal limits  CBC WITH DIFFERENTIAL/PLATELET - Abnormal; Notable for the following components:   RBC 3.66 (*)    Hemoglobin 11.8 (*)    Platelets 86 (*)    All other components within normal limits  URINALYSIS, ROUTINE W REFLEX MICROSCOPIC - Abnormal; Notable for the following components:   Hgb urine dipstick SMALL (*)    All other components within normal limits    EKG EKG Interpretation  Date/Time:  Sunday June 10 2019 20:27:21 EDT Ventricular Rate:  62 PR Interval:    QRS Duration: 147 QT Interval:  557 QTC Calculation: 566 R Axis:   -51 Text Interpretation:  Sinus rhythm Left bundle branch block Confirmed by Lennice Sites [phone]) on 06/10/2019 8:42:49 PM   Radiology Ct Head Wo Contrast  Result Date: 06/10/2019 CLINICAL DATA:  85 year old female status post fall at 1830 hours today. Syncopal episodes. EXAM: CT HEAD WITHOUT CONTRAST TECHNIQUE: Contiguous axial images were obtained  from the base of the skull through the vertex without intravenous contrast. COMPARISON:  Brain MRI 04/29/2015. Head CT 05/13/2019. FINDINGS: Brain: Stable cerebral volume. Chronic encephalomalacia in the lateral left occipital lobe. Small chronic infarct in  the right cerebellum. Mild heterogeneity in the left basal ganglia and in the pons appears stable. Stable gray-white matter differentiation throughout the brain. No midline shift, ventriculomegaly, mass effect, evidence of mass lesion, intracranial hemorrhage or evidence of cortically based acute infarction. Vascular: Calcified atherosclerosis at the skull base. No suspicious intracranial vascular hyperdensity. Skull: Chronic skull base and upper cervical spine degeneration. No acute osseous abnormality identified. Sinuses/Orbits: Visualized paranasal sinuses and mastoids are stable and well pneumatized. Other: No scalp hematoma identified. Stable orbits. IMPRESSION: 1. No acute intracranial abnormality or acute traumatic injury identified. 2. Chronic ischemic disease most pronounced in the left PCA and right cerebellar artery territories. Electronically Signed   By: Genevie Ann M.D.   On: 06/10/2019 21:40   Dg Chest Port 1 View  Result Date: 06/10/2019 CLINICAL DATA:  85 year old female with weakness and altered mental status. Syncope, fall. EXAM: PORTABLE CHEST 1 VIEW COMPARISON:  Chest radiographs 02/28/2016 and earlier. Left shoulder series 01/05/2019. FINDINGS: Portable AP semi upright view at 2131 hours. Stable cardiomegaly and mediastinal contours. Stable somewhat large lung volumes. Visualized tracheal air column is within normal limits. Allowing for portable technique the lungs are clear. Chronic left upper rib fractures and proximal left humerus deformity appear stable. Stable dystrophic calcification of the right breast/chest wall. IMPRESSION: 1. No acute cardiopulmonary abnormality. No acute traumatic injury identified. 2. Chronic cardiomegaly.  Chronic left upper rib fractures and proximal left humerus deformity. Electronically Signed   By: Genevie Ann M.D.   On: 06/10/2019 21:43    Procedures Procedures (including critical care time)  Medications Ordered in ED Medications - No data to display   Initial Impression / Assessment and Plan / ED Course  I have reviewed the triage vital signs and the nursing notes.  Pertinent labs & imaging results that were available during my care of the patient were reviewed by me and considered in my medical decision making (see chart for details).  85 year old female presents with agitation at her SNF with a minor head injury. On exam she is pleasantly confused. There are no obvious injuries. BP is elevated but otherwise vitals are normal. Will obtain labs, [place], CT head, EKG, CXR  Labs and imaging do not show any acute findings. Shared visit with Dr. Ronnald Nian. Her behavior is likely dementia related. Will d/c back to SNF.  Final Clinical Impressions(s) / ED Diagnoses   Final diagnoses:  Agitation due to dementia [facility])    ED Discharge Orders    None       Recardo Evangelist, PA-C 06/10/19 [place], [place], DO 06/11/19 0006

## 2019-06-10 NOTE — ED Notes (Signed)
Report called to RN at [facility]

## 2019-06-10 NOTE — ED Notes (Signed)
Discharge instructions discussed w/ RN at facility

## 2019-06-10 NOTE — ED Triage Notes (Addendum)
Pt presents to ED from [facility] [place]. Per facility pt assisted fall around 1830 today. Per facility pt has had multiple "syncopal" episodes since 1830. Where pt lays and is unresponsive. Per EMS facility not great historian. Unsure when confusion began but did not begin after fall. Unsure if pt was treated for diagnosed UTI last month. Pt not complaining of any pain and AAO to self. EMS VSS.

## 2019-06-10 NOTE — ED Notes (Signed)
[facility] called for transport.  

## 2019-08-20 ENCOUNTER — Other Ambulatory Visit: Payer: Self-pay

## 2019-08-20 ENCOUNTER — Encounter (HOSPITAL_COMMUNITY): Payer: Self-pay | Admitting: *Deleted

## 2019-08-20 ENCOUNTER — Emergency Department (HOSPITAL_COMMUNITY): Payer: Medicare Other

## 2019-08-20 ENCOUNTER — Emergency Department (HOSPITAL_COMMUNITY)
Admission: EM | Admit: 2019-08-20 | Discharge: 2019-08-20 | Disposition: A | Discharge status: Home / Self Care | Payer: Medicare Other | Attending: Emergency Medicine | Admitting: Emergency Medicine

## 2019-08-20 DIAGNOSIS — W19XXXA Unspecified fall, initial encounter: Secondary | ICD-10-CM | POA: Insufficient documentation

## 2019-08-20 DIAGNOSIS — R52 Pain, unspecified: Secondary | ICD-10-CM | POA: Diagnosis not present

## 2019-08-20 DIAGNOSIS — I10 Essential (primary) hypertension: Secondary | ICD-10-CM | POA: Diagnosis not present

## 2019-08-20 DIAGNOSIS — Z8572 Personal history of non-Hodgkin lymphomas: Secondary | ICD-10-CM | POA: Insufficient documentation

## 2019-08-20 DIAGNOSIS — R634 Abnormal weight loss: Secondary | ICD-10-CM | POA: Diagnosis not present

## 2019-08-20 DIAGNOSIS — Y9389 Activity, other specified: Secondary | ICD-10-CM | POA: Insufficient documentation

## 2019-08-20 DIAGNOSIS — Z79899 Other long term (current) drug therapy: Secondary | ICD-10-CM | POA: Diagnosis not present

## 2019-08-20 DIAGNOSIS — Y92128 Other place in nursing home as the place of occurrence of the external cause: Secondary | ICD-10-CM | POA: Diagnosis not present

## 2019-08-20 DIAGNOSIS — Z8673 Personal history of transient ischemic attack (TIA), and cerebral infarction without residual deficits: Secondary | ICD-10-CM | POA: Diagnosis not present

## 2019-08-20 DIAGNOSIS — F0390 Unspecified dementia without behavioral disturbance: Secondary | ICD-10-CM | POA: Insufficient documentation

## 2019-08-20 DIAGNOSIS — Y998 Other external cause status: Secondary | ICD-10-CM | POA: Diagnosis not present

## 2019-08-20 DIAGNOSIS — C8590 Non-Hodgkin lymphoma, unspecified, unspecified site: Secondary | ICD-10-CM | POA: Insufficient documentation

## 2019-08-20 DIAGNOSIS — Z043 Encounter for examination and observation following other accident: Secondary | ICD-10-CM | POA: Diagnosis present

## 2019-08-20 HISTORY — DX: Non-Hodgkin lymphoma, unspecified, unspecified site: C85.90

## 2019-08-20 LAB — CBC WITH DIFFERENTIAL/PLATELET
Abs Immature Granulocytes: 0.04 10*3/uL (ref 0.00–0.07)
Basophils Absolute: 0 10*3/uL (ref 0.0–0.1)
Basophils Relative: 0 %
Eosinophils Absolute: 0 10*3/uL (ref 0.0–0.5)
Eosinophils Relative: 0 %
HCT: 39 % (ref 36.0–46.0)
Hemoglobin: 12.3 g/dL (ref 12.0–15.0)
Immature Granulocytes: 1 %
Lymphocytes Relative: 19 %
Lymphs Abs: 0.8 10*3/uL (ref 0.7–4.0)
MCH: 31.8 pg (ref 26.0–34.0)
MCHC: 31.5 g/dL (ref 30.0–36.0)
MCV: 100.8 fL — ABNORMAL HIGH (ref 80.0–100.0)
Monocytes Absolute: 0.9 10*3/uL (ref 0.1–1.0)
Monocytes Relative: 20 %
Neutro Abs: 2.7 10*3/uL (ref 1.7–7.7)
Neutrophils Relative %: 60 %
Platelets: 179 10*3/uL (ref 150–400)
RBC: 3.87 MIL/uL (ref 3.87–5.11)
RDW: 13.5 % (ref 11.5–15.5)
WBC: 4.4 10*3/uL (ref 4.0–10.5)
nRBC: 0 % (ref 0.0–0.2)

## 2019-08-20 LAB — COMPREHENSIVE METABOLIC PANEL
ALT: 39 U/L (ref 0–44)
AST: 66 U/L — ABNORMAL HIGH (ref 15–41)
Albumin: 3.5 g/dL (ref 3.5–5.0)
Alkaline Phosphatase: 66 U/L (ref 38–126)
Anion gap: 12 (ref 5–15)
BUN: 15 mg/dL (ref 8–23)
CO2: 21 mmol/L — ABNORMAL LOW (ref 22–32)
Calcium: 8.9 mg/dL (ref 8.9–10.3)
Chloride: 104 mmol/L (ref 98–111)
Creatinine, Ser: 0.86 mg/dL (ref 0.44–1.00)
GFR calc Af Amer: >60 mL/min (ref >60)
GFR calc non Af Amer: 58 mL/min — ABNORMAL LOW (ref >60)
Glucose, Bld: 104 mg/dL — ABNORMAL HIGH (ref 70–99)
Potassium: 3.5 mmol/L (ref 3.5–5.1)
Sodium: 137 mmol/L (ref 135–145)
Total Bilirubin: 0.4 mg/dL (ref 0.3–1.2)
Total Protein: 6.1 g/dL — ABNORMAL LOW (ref 6.5–8.1)

## 2019-08-20 MED ORDER — ALPRAZOLAM 0.25 MG PO TABS
0.2500 mg | ORAL_TABLET | Freq: Once | ORAL | Status: AC
  Administered 2019-08-20: 0.25 mg via ORAL
  Filled 2019-08-20: qty 1

## 2019-08-20 NOTE — ED Provider Notes (Signed)
Signout from Dr. Ralene Bathe.  85 year old female found down at her facility.  Initially was less responsive but has improved in her mental status here. Physical Exam  BP (!) 158/70   Pulse (!) 59   Temp 98.7 F (37.1 C) (Oral)   Resp 16   Wt 42.5 kg   SpO2 98%   BMI 17.16 kg/m   Physical Exam  ED Course/Procedures     Procedures  MDM  Plan is to ambulate patient and if she does well by this can return to the facility.  Patient ambulated here and so we are calling the ambulance to discharge.       Hayden Rasmussen, MD 08/20/19 [phone]

## 2019-08-20 NOTE — ED Notes (Signed)
Nurse Navigator spoke with daughter and updated her that her mother is here.  Daughter is concerned about pt weight loss (20lbs when comparing the weight on file to the weight obtained on stretcher today) Daughter is requesting that our EDP write a script for something for this (ensure?) so she may have this at the SNF.

## 2019-08-20 NOTE — ED Notes (Signed)
Called her daughter and updated her.  She states that the bump on the back of pt head has been there for years.  I mentioned that pt appears to have pain in right arm and daughter states that she has broken right arm in the past and has some pain from this.

## 2019-08-20 NOTE — Discharge Instructions (Addendum)
You were seen in the emergency department for evaluation of injuries from a fall.  You had blood work and a CAT scan of your head and neck along with x-rays of your right hip that did not find any serious abnormalities.  Please follow-up with your doctor and return to the emergency department if any worsening symptoms.

## 2019-08-20 NOTE — ED Triage Notes (Signed)
Pt with hx of dementia is here from [facility] SNF where she fell while walking in the hall with walker, this was an unwitnessed fall.  Fall was heard (pt fell back in a carpeted hall).  Per staff at SNF the knot at the back of her head is not new.  Pt has c-collar in place. Pt has a kn95 mask in place.  Pt is altered at baseline, ambulatory.  Pt appears in no distress.

## 2019-08-20 NOTE — ED Notes (Signed)
Pt able to ambulate without gross pain responses.  Incontinent of stool and cleansed and brief changed with standing with another person (A).  Pt more pleasant and cooperative.

## 2019-08-20 NOTE — ED Notes (Signed)
positioned to avoid movement out of bed.  Less anxious and less calling out at this time.

## 2019-08-20 NOTE — ED Notes (Signed)
Per ems report pt was walking down the carpeted hall near the nurses station when they heard her fall.  They responded to her and found her alert and laying on her back, no distress.  EMS applied c-collar. EMS states that they did not observe any injuries (they state that there is a bump on the back of her head which per SNF staff is pre-existing).  Pt appears to have pain in her right arm when I palpate and try and apply BP cuff.

## 2019-08-20 NOTE — ED Notes (Signed)
PT IS NUMBER 11 ON THE LIST TO BE PICK UP BY [facility] CALLED BY DEE

## 2019-08-20 NOTE — ED Provider Notes (Signed)
[facility] Provider Note   CSN: [id number] Arrival date & time: 08/20/19  1235     History Chief Complaint  Patient presents with  . Fall    Audrey Alonzo is a 85 y.o. female.  The history is provided by the patient, a relative and medical records. No language interpreter was used.  Fall   Kailyne Woolfork is a 85 y.o. female who presents to the Emergency Department complaining of fall.  Level V caveat due to dementia. History is provided by EMS. She presents the emergency department by EMS for evaluation of injuries following an unwitnessed fall. Patient complains of pain on examination but is not able to express where the pain is. Additional history available after patient's initial ED arrival. The daughter states that the patient is confused at baseline, sometimes more than others. She is an active walker and walks frequently. 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 Daughter reports that there is been a weight loss of 20 pounds over the last year. They reports of recent illnesses, fevers, cough, vomiting, diarrhea.     Past Medical History:  Diagnosis Date  . Anxiety   . Cancer ([place])   . Lymphoma ([place])    NO BLOOD DRAWS OR IV START TO RUE  . Stroke [facility])     Patient Active Problem List   Diagnosis Date Noted  . Lymphoma ([place])   . Peripheral vertigo 04/29/2015  . Hypokalemia 04/29/2015  . HTN (hypertension) 04/29/2015  . Hyperglycemia 04/29/2015  . Dehydration 04/29/2015  . Vertigo 04/29/2015  . Anxiety     Past Surgical History:  Procedure Laterality Date  . APPENDECTOMY    . BREAST SURGERY     partial right breast   . TONSILLECTOMY       OB History   No obstetric history on file.     Family History  Problem Relation Age of Onset  . Heart disease Mother   . Heart disease Father   . Heart disease Brother   . Heart disease Brother   . Heart disease Brother     Social History    Tobacco Use  . Smoking status: Never Smoker  . Smokeless tobacco: Never Used  Substance Use Topics  . Alcohol use: Yes    Alcohol/week: 1.0 standard drinks    Types: 1 Glasses of wine per week    Comment: rarely  . Drug use: No    Home Medications Prior to Admission medications   Medication Sig Start Date End Date Taking? Authorizing Provider  acetaminophen (TYLENOL) 325 MG tablet Take 650 mg by mouth every 6 (six) hours as needed (for pain or fever).     [provider]  ALPRAZolam Duanne Moron) 0.25 MG tablet Take 0.25 mg by mouth every 6 (six) hours as needed for anxiety.    [provider]  amLODipine (NORVASC) 5 MG tablet Take 1 tablet (5 mg total) by mouth daily. 05/01/15   Ghimire, Henreitta Leber, MD  Calcium Carbonate Antacid (TUMS ULTRA PO) Take 1 tablet by mouth 2 (two) times daily.    [provider]  Ensure (ENSURE) Take 237 mLs by mouth 3 (three) times daily between meals.    [provider]  lovastatin (MEVACOR) 20 MG tablet Take 20 mg by mouth every evening.  03/07/15   [provider]  ondansetron (ZOFRAN) 4 MG tablet Take 4 mg by mouth every 8 (eight) hours as needed for nausea or  vomiting.    [provider]  sertraline (ZOLOFT) 25 MG tablet Take 50 mg by mouth 2 (two) times a day.    [provider]    Allergies    Patient has no known allergies.  Review of Systems   Review of Systems  All other systems reviewed and are negative.   Physical Exam Updated Vital Signs BP (!) 158/70   Pulse (!) 59   Temp 98.7 F (37.1 C) (Oral)   Resp 16   Wt 42.5 kg   SpO2 98%   BMI 17.16 kg/m   Physical Exam Vitals and nursing note reviewed.  Constitutional:      Appearance: She is well-developed.  HENT:     Head: Normocephalic.  Cardiovascular:     Rate and Rhythm: Normal rate and regular rhythm.     Heart sounds: No murmur.  Pulmonary:     Effort: Pulmonary effort is normal. No respiratory distress.      Breath sounds: Normal breath sounds.  Abdominal:     Palpations: Abdomen is soft.     Tenderness: There is no abdominal tenderness. There is no guarding or rebound.  Musculoskeletal:        General: Tenderness present.     Comments: TTP to right hip  Skin:    General: Skin is warm and dry.  Neurological:     Mental Status: She is alert.     Comments: Confused, does not follow commands.  MAE.    Psychiatric:        Behavior: Behavior normal.     ED Results / Procedures / Treatments   Labs (all labs ordered are listed, but only abnormal results are displayed) Labs Reviewed  CBC WITH DIFFERENTIAL/PLATELET - Abnormal; Notable for the following components:      Result Value   MCV 100.8 (*)    All other components within normal limits  COMPREHENSIVE METABOLIC PANEL - Abnormal; Notable for the following components:   CO2 21 (*)    Glucose, Bld 104 (*)    Total Protein 6.1 (*)    AST 66 (*)    GFR calc non Af Amer 58 (*)    All other components within normal limits  URINALYSIS, ROUTINE W REFLEX MICROSCOPIC    EKG None  Radiology DG Chest 1 View  Result Date: 08/20/2019 CLINICAL DATA:  [place] while walking in hallway, unwitnessed; history of ovarian and breast cancer EXAM: CHEST  1 VIEW COMPARISON:  06/10/2019 FINDINGS: Enlargement of cardiac silhouette. Mediastinal contours and pulmonary vascularity normal. Atherosclerotic calcification aorta. Emphysematous and bronchitic changes consistent with COPD. No acute infiltrate, pleural effusion or pneumothorax. Osseous demineralization with biconvex thoracolumbar scoliosis and multiple old LEFT rib fractures. IMPRESSION: Enlargement of cardiac silhouette. COPD changes without acute abnormality. Electronically Signed   By: Lavonia Dana M.D.   On: 08/20/2019 14:21   CT Head Wo Contrast  Result Date: 08/20/2019 CLINICAL DATA:  [place] backwards today and hit the back of her head. Posterior scalp swelling. EXAM: CT HEAD WITHOUT CONTRAST CT  CERVICAL SPINE WITHOUT CONTRAST TECHNIQUE: Multidetector CT imaging of the head and cervical spine was performed following the standard protocol without intravenous contrast. Multiplanar CT image reconstructions of the cervical spine were also generated. COMPARISON:  Head CT dated 06/10/2019. Cervical spine CT dated 05/13/2019. FINDINGS: CT HEAD FINDINGS Brain: Stable moderately enlarged ventricles and subarachnoid spaces. Stable mild patchy white matter low density in both cerebral hemispheres. Stable old left occipital lobe infarct and small, old right  cerebellar hemisphere infarct. Stable mild diffuse patchy low density in the pons. No intracranial hemorrhage, mass lesion or CT evidence of acute infarction. Vascular: No hyperdense vessel or unexpected calcification. Skull: Normal. Negative for fracture or focal lesion. Sinuses/Orbits: Status post cataract extraction on the right. Broad-based bulge in the posterior aspect of the globe on the left, centered medially. This is unchanged. Unremarkable bones and included paranasal sinuses. Other: Mild bilateral concha bullosa. CT CERVICAL SPINE FINDINGS Alignment: Normal. Skull base and vertebrae: No acute fracture. No primary bone lesion or focal pathologic process. Soft tissues and spinal canal: No prevertebral fluid or swelling. No visible canal hematoma. Disc levels: Mild multilevel degenerative changes with more pronounced facet degenerative changes at multiple levels. Upper chest: Biapical pleural and parenchymal scarring. Other: Dense bilateral carotid artery calcifications. IMPRESSION: 1. No skull fracture or intracranial hemorrhage. 2. No cervical spine fracture or subluxation. 3. Stable left orbital staphyloma. 4. Stable diffuse cerebral and cerebellar atrophy, chronic small vessel white matter ischemic changes and old infarcts. 5. Multilevel cervical spine degenerative changes. 6. Dense bilateral carotid artery atheromatous calcifications. Electronically  Signed   By: Claudie Revering M.D.   On: 08/20/2019 14:48   CT Cervical Spine Wo Contrast  Result Date: 08/20/2019 CLINICAL DATA:  [place] backwards today and hit the back of her head. Posterior scalp swelling. EXAM: CT HEAD WITHOUT CONTRAST CT CERVICAL SPINE WITHOUT CONTRAST TECHNIQUE: Multidetector CT imaging of the head and cervical spine was performed following the standard protocol without intravenous contrast. Multiplanar CT image reconstructions of the cervical spine were also generated. COMPARISON:  Head CT dated 06/10/2019. Cervical spine CT dated 05/13/2019. FINDINGS: CT HEAD FINDINGS Brain: Stable moderately enlarged ventricles and subarachnoid spaces. Stable mild patchy white matter low density in both cerebral hemispheres. Stable old left occipital lobe infarct and small, old right cerebellar hemisphere infarct. Stable mild diffuse patchy low density in the pons. No intracranial hemorrhage, mass lesion or CT evidence of acute infarction. Vascular: No hyperdense vessel or unexpected calcification. Skull: Normal. Negative for fracture or focal lesion. Sinuses/Orbits: Status post cataract extraction on the right. Broad-based bulge in the posterior aspect of the globe on the left, centered medially. This is unchanged. Unremarkable bones and included paranasal sinuses. Other: Mild bilateral concha bullosa. CT CERVICAL SPINE FINDINGS Alignment: Normal. Skull base and vertebrae: No acute fracture. No primary bone lesion or focal pathologic process. Soft tissues and spinal canal: No prevertebral fluid or swelling. No visible canal hematoma. Disc levels: Mild multilevel degenerative changes with more pronounced facet degenerative changes at multiple levels. Upper chest: Biapical pleural and parenchymal scarring. Other: Dense bilateral carotid artery calcifications. IMPRESSION: 1. No skull fracture or intracranial hemorrhage. 2. No cervical spine fracture or subluxation. 3. Stable left orbital staphyloma. 4. Stable  diffuse cerebral and cerebellar atrophy, chronic small vessel white matter ischemic changes and old infarcts. 5. Multilevel cervical spine degenerative changes. 6. Dense bilateral carotid artery atheromatous calcifications. Electronically Signed   By: Claudie Revering M.D.   On: 08/20/2019 14:48   DG Hip Unilat W or Wo Pelvis 2-3 Views Right  Result Date: 08/20/2019 CLINICAL DATA:  [place] while walking in the hall with a walker, unwitnessed fall EXAM: DG HIP (WITH OR WITHOUT PELVIS) 2-3V RIGHT COMPARISON:  05/13/2019 FINDINGS: Diffuse osseous demineralization. Hip and SI joint spaces preserved. No acute fracture, dislocation, or bone destruction. Degenerative disc and facet disease changes at visualized lumbar spine. IMPRESSION: Osseous demineralization without acute bony abnormalities. Electronically Signed   By: Crist Infante.D.  On: 08/20/2019 14:08    Procedures Procedures (including critical care time)  Medications Ordered in ED Medications  ALPRAZolam Duanne Moron) tablet 0.25 mg (0.25 mg Oral Given 08/20/19 1512)    ED Course  I have reviewed the triage vital signs and the nursing notes.  Pertinent labs & imaging results that were available during my care of the patient were reviewed by me and considered in my medical decision making (see chart for details).    MDM Rules/Calculators/A&P                      Patient here for evaluation of injuries following a fall that was unwitnessed. On initial assessment patient was significant tenderness to the right hip and appears uncomfortable. On repeat assessment patient is very conversant, confused but does not appear to have any pain unquestioning or on palpation. Plain films are negative for acute fracture. Plan to trial ambulation and if patient is able to ambulate without pain will be able to be discharged back to her facility. If she does have significant pain on ambulation she will need additional imaging of her hip. Final Clinical Impression(s)  / ED Diagnoses Final diagnoses:  None    Rx / DC Orders ED Discharge Orders    None       Quintella Reichert, MD 08/20/19 1606

## 2019-08-20 NOTE — ED Notes (Signed)
Pt very agitated and fighting against nurse and tech.  Requires frequent redirection and does not calm.  Able to get her to drink water without gross difficulty

## 2019-08-20 NOTE — ED Notes (Signed)
DAUGHTER CALLED BACK WITH UPDATE ON MEDICAL HISTORY.   REPORTS NO BLOOD DRAWS OR IV STARTS TO RUE D/T HX OF LYMPHOMA.

## 2019-08-26 ENCOUNTER — Emergency Department (HOSPITAL_COMMUNITY)
Admission: EM | Admit: 2019-08-26 | Discharge: 2019-08-26 | Disposition: A | Discharge status: Home / Self Care | Payer: Medicare Other | Attending: Emergency Medicine | Admitting: Emergency Medicine

## 2019-08-26 ENCOUNTER — Emergency Department (HOSPITAL_COMMUNITY): Payer: Medicare Other

## 2019-08-26 DIAGNOSIS — Z8673 Personal history of transient ischemic attack (TIA), and cerebral infarction without residual deficits: Secondary | ICD-10-CM | POA: Diagnosis not present

## 2019-08-26 DIAGNOSIS — U071 COVID-19: Secondary | ICD-10-CM | POA: Insufficient documentation

## 2019-08-26 DIAGNOSIS — Z79899 Other long term (current) drug therapy: Secondary | ICD-10-CM | POA: Insufficient documentation

## 2019-08-26 DIAGNOSIS — Z8572 Personal history of non-Hodgkin lymphomas: Secondary | ICD-10-CM | POA: Insufficient documentation

## 2019-08-26 DIAGNOSIS — R1110 Vomiting, unspecified: Secondary | ICD-10-CM | POA: Insufficient documentation

## 2019-08-26 DIAGNOSIS — R5383 Other fatigue: Secondary | ICD-10-CM | POA: Diagnosis present

## 2019-08-26 LAB — CBC
HCT: 44.8 % (ref 36.0–46.0)
Hemoglobin: 14 g/dL (ref 12.0–15.0)
MCH: 30.8 pg (ref 26.0–34.0)
MCHC: 31.3 g/dL (ref 30.0–36.0)
MCV: 98.5 fL (ref 80.0–100.0)
Platelets: 161 10*3/uL (ref 150–400)
RBC: 4.55 MIL/uL (ref 3.87–5.11)
RDW: 13.4 % (ref 11.5–15.5)
WBC: 4 10*3/uL (ref 4.0–10.5)
nRBC: 0 % (ref 0.0–0.2)

## 2019-08-26 LAB — BASIC METABOLIC PANEL
Anion gap: 10 (ref 5–15)
BUN: 31 mg/dL — ABNORMAL HIGH (ref 8–23)
CO2: 25 mmol/L (ref 22–32)
Calcium: 9.1 mg/dL (ref 8.9–10.3)
Chloride: 104 mmol/L (ref 98–111)
Creatinine, Ser: 1.28 mg/dL — ABNORMAL HIGH (ref 0.44–1.00)
GFR calc Af Amer: 42 mL/min — ABNORMAL LOW (ref >60)
GFR calc non Af Amer: 36 mL/min — ABNORMAL LOW (ref >60)
Glucose, Bld: 109 mg/dL — ABNORMAL HIGH (ref 70–99)
Potassium: 4.5 mmol/L (ref 3.5–5.1)
Sodium: 139 mmol/L (ref 135–145)

## 2019-08-26 MED ORDER — ONDANSETRON 4 MG PO TBDP
4.0000 mg | ORAL_TABLET | Freq: Once | ORAL | Status: AC
  Administered 2019-08-26: 14:00:00 4 mg via ORAL
  Filled 2019-08-26: qty 1

## 2019-08-26 MED ORDER — SODIUM CHLORIDE 0.9 % IV BOLUS
500.0000 mL | Freq: Once | INTRAVENOUS | Status: AC
  Administered 2019-08-26: 500 mL via INTRAVENOUS

## 2019-08-26 MED ORDER — ONDANSETRON 4 MG PO TBDP: 4.0000 mg | ORAL_TABLET | Freq: Three times a day (TID) | ORAL | 0 refills | Status: AC | PRN

## 2019-08-26 NOTE — ED Provider Notes (Signed)
[facility] DEPT Provider Note   CSN: [id number] Arrival date & time: 08/26/19  1256     History No chief complaint on file.   Priscilla Leon is a 85 y.o. female.  HPI   Patient presents to the emergency room for evaluation of increased fatigue in the setting of a Covid diagnosis.  According to the EMS report the patient was diagnosed with Covid.  The exact timing of that is unknown although the patient was seen in the emergency room on December 21 and there was no mention of her having Covid at that time.  According to the EMS report the staff at the facility felt that the patient was more listless today.  She was not eating as well.  She also had an episode of vomiting.  No reported fevers.  Patient has not mentioned any issues with her breathing.  In the emergency room history is somewhat difficult as the patient seems to be very hard of hearing.  However she denies having any trouble with breathing for me.  She denies having any pain.  She states she feels fine at this time.  Past Medical History:  Diagnosis Date  . Anxiety   . Cancer ([place])   . Lymphoma ([place])    NO BLOOD DRAWS OR IV START TO RUE  . Stroke [facility])     Patient Active Problem List   Diagnosis Date Noted  . Lymphoma ([place])   . Peripheral vertigo 04/29/2015  . Hypokalemia 04/29/2015  . HTN (hypertension) 04/29/2015  . Hyperglycemia 04/29/2015  . Dehydration 04/29/2015  . Vertigo 04/29/2015  . Anxiety     Past Surgical History:  Procedure Laterality Date  . APPENDECTOMY    . BREAST SURGERY     partial right breast   . TONSILLECTOMY       OB History   No obstetric history on file.     Family History  Problem Relation Age of Onset  . Heart disease Mother   . Heart disease Father   . Heart disease Brother   . Heart disease Brother   . Heart disease Brother     Social History   Tobacco Use  . Smoking status: Never Smoker  . Smokeless tobacco: Never Used   Substance Use Topics  . Alcohol use: Yes    Alcohol/week: 1.0 standard drinks    Types: 1 Glasses of wine per week    Comment: rarely  . Drug use: No    Home Medications Prior to Admission medications   Medication Sig Start Date End Date Taking? Authorizing Provider  acetaminophen (TYLENOL) 325 MG tablet Take 650 mg by mouth every 6 (six) hours as needed (for pain or fever).    Yes [provider]  ALPRAZolam (XANAX) 0.25 MG tablet Take 0.25 mg by mouth every 6 (six) hours as needed for anxiety.   Yes [provider]  amLODipine (NORVASC) 5 MG tablet Take 1 tablet (5 mg total) by mouth daily. 05/01/15  Yes Ghimire, Henreitta Leber, MD  Calcium Carbonate Antacid (TUMS ULTRA PO) Take 1 tablet by mouth 2 (two) times daily.   Yes [provider]  Ensure (ENSURE) Take 237 mLs by mouth 3 (three) times daily between meals. Vanilla Flavor   Yes [provider]  lovastatin (MEVACOR) 20 MG tablet Take 20 mg by mouth every evening.  03/07/15  Yes [provider]  ondansetron (ZOFRAN) 4 MG tablet Take 4 mg by mouth every 8 (eight) hours as needed  for nausea or vomiting.   Yes [provider]  sertraline (ZOLOFT) 25 MG tablet Take 50 mg by mouth 2 (two) times a day.   Yes [provider]  ondansetron (ZOFRAN ODT) 4 MG disintegrating tablet Take 1 tablet (4 mg total) by mouth every 8 (eight) hours as needed for nausea or vomiting. 08/26/19   Dorie Rank, MD  sertraline (ZOLOFT) 50 MG tablet Take 100 mg by mouth daily. 08/07/19   [provider]    Allergies    Patient has no known allergies.  Review of Systems   Review of Systems  All other systems reviewed and are negative.   Physical Exam Updated Vital Signs BP (!) 155/76   Pulse 70   Temp 98.1 F (36.7 C) (Oral)   Resp 18   SpO2 97%   Physical Exam Vitals and nursing note reviewed.  Constitutional:      General: She is not in acute distress.    Appearance: She is  well-developed.     Comments: Elderly, frail  HENT:     Head: Normocephalic and atraumatic.     Right Ear: External ear normal.     Left Ear: External ear normal.  Eyes:     General: No scleral icterus.       Right eye: No discharge.        Left eye: No discharge.     Conjunctiva/sclera: Conjunctivae normal.  Neck:     Trachea: No tracheal deviation.  Cardiovascular:     Rate and Rhythm: Normal rate and regular rhythm.  Pulmonary:     Effort: Pulmonary effort is normal. No respiratory distress.     Breath sounds: Normal breath sounds. No stridor. No wheezing or rales.  Abdominal:     General: Bowel sounds are normal. There is no distension.     Palpations: Abdomen is soft.     Tenderness: There is no abdominal tenderness. There is no guarding or rebound.  Musculoskeletal:        General: No tenderness.     Cervical back: Neck supple.  Skin:    General: Skin is warm and dry.     Findings: No rash.  Neurological:     Mental Status: She is alert.     Cranial Nerves: No cranial nerve deficit (no facial droop, extraocular movements intact, no slurred speech).     Sensory: No sensory deficit.     Motor: No abnormal muscle tone or seizure activity.     Coordination: Coordination normal.     ED Results / Procedures / Treatments   Labs (all labs ordered are listed, but only abnormal results are displayed) Labs Reviewed  BASIC METABOLIC PANEL - Abnormal; Notable for the following components:      Result Value   Glucose, Bld 109 (*)    BUN 31 (*)    Creatinine, Ser 1.28 (*)    GFR calc non Af Amer 36 (*)    GFR calc Af Amer 42 (*)    All other components within normal limits  CBC    EKG EKG Interpretation  Date/Time:  Sunday August 26 2019 13:36:48 EST Ventricular Rate:  78 PR Interval:    QRS Duration: 135 QT Interval:  430 QTC Calculation: 490 R Axis:   -87 Text Interpretation: Sinus rhythm Left bundle branch block Baseline wander in lead(s) I II aVR aVF  Artifact No significant change since last tracing Confirmed by Dorie Rank [phone]) on 08/26/2019 1:42:24 PM   Radiology  DG Chest Portable 1 View  Result Date: 08/26/2019 CLINICAL DATA:  85 year old nursing home patient with dementia and positive COVID-19 testing. Lethargy and vomiting. EXAM: PORTABLE CHEST 1 VIEW COMPARISON:  08/20/2019 and earlier. FINDINGS: Cardiac silhouette mildly enlarged for AP portable technique, unchanged. Mildly prominent bronchovascular markings diffusely and mild central peribronchial thickening, unchanged. Lungs otherwise clear. No localized airspace consolidation. No pleural effusions. No pneumothorax. Normal pulmonary vascularity. IMPRESSION: 1. Stable mild changes of chronic bronchitis and/or asthma. No acute cardiopulmonary disease. 2. Stable mild cardiomegaly without pulmonary edema. Electronically Signed   By: Evangeline Dakin M.D.   On: 08/26/2019 14:44    Procedures Procedures (including critical care time)  Medications Ordered in ED Medications  sodium chloride 0.9 % bolus 500 mL (has no administration in time range)  ondansetron (ZOFRAN-ODT) disintegrating tablet 4 mg (4 mg Oral Given 08/26/19 1412)    ED Course  I have reviewed the triage vital signs and the nursing notes.  Pertinent labs & imaging results that were available during my care of the patient were reviewed by me and considered in my medical decision making (see chart for details).  Clinical Course as of Aug 25 1517  Sun Aug 26, 2019  1509 Patient's CBC is normal.   [JK]  [id number] Metabolic panel shows a slight increase in the BUN and creatinine   [JK]  1510 Chest x-ray does not show any infiltrates.   [JK]    Clinical Course User Index [JK] Dorie Rank, MD   MDM Rules/Calculators/A&P                     Patient presented to the emergency room for evaluation of decreased appetite and an episode of vomiting associated with a known Covid infection.  Patient fortunately in the emergency  room is not having any nausea and vomiting.  Her vitals are stable.  She is breathing normally and does not have an oxygen requirement.  Chest x-ray does not show any pneumonia.  Laboratory tests are notable for mild increase in her BUN and creatinine so she was given a dose of IV fluids.  Patient appears stable for discharge.  I spoke with the patient's daughter and informed her of the results and evaluation today.  I will give the patient a prescription for Zofran to help her with her nausea and possibly help her with her appetite.  The daughter will pick that prescription up today and bring it to the facility. Final Clinical Impression(s) / ED Diagnoses Final diagnoses:  COVID-19 virus infection    Rx / DC Orders ED Discharge Orders         Ordered    ondansetron (ZOFRAN ODT) 4 MG disintegrating tablet  Every 8 hours PRN     08/26/19 1516           Dorie Rank, MD 08/26/19 [phone]

## 2019-08-26 NOTE — ED Notes (Signed)
Patient has been stuck x4 by 2 different RN's in attempt to gain IV access.

## 2019-08-26 NOTE — ED Triage Notes (Signed)
Pt from [facility] with hx of dementia, reportedly COVID +. Patient is baseline oriented. Patient is reportedly more lethargic and had 1 episode of emesis

## 2019-08-26 NOTE — ED Notes (Signed)
[facility] called for transport.  

## 2019-08-26 NOTE — Discharge Instructions (Signed)
Take the medications as needed for nausea and try to make sure you are eating and drinking. monitor for difficulty with your breathing.  Return to the ED for shortness of breath.

## 2019-08-26 NOTE — ED Notes (Signed)
Patient was seen sitting at the edge of stretcher with clothes off and IV pulled out. Patient repositioned back into bed.

## 2019-10-01 DEATH — deceased

## 2021-07-18 IMAGING — CT CT HEAD W/O CM
4 series · 15 of 47 positions shown, 17 images · non-contrast
Comparison: Brain MRI 04/29/2015. Head CT 05/13/2019.

CLINICAL DATA: [AGE] female status post fall at 7158 hours
today. Syncopal episodes.

EXAM:
CT HEAD WITHOUT CONTRAST
TECHNIQUE: Contiguous axial images were obtained from the base of the skull
through the vertex without intravenous contrast.

[Series 3: head wo · axial · 0.43mm/px · z∈[-160,-40]mm · 7 of 33 slices shown, 9 images]
[im 5/33  brain]
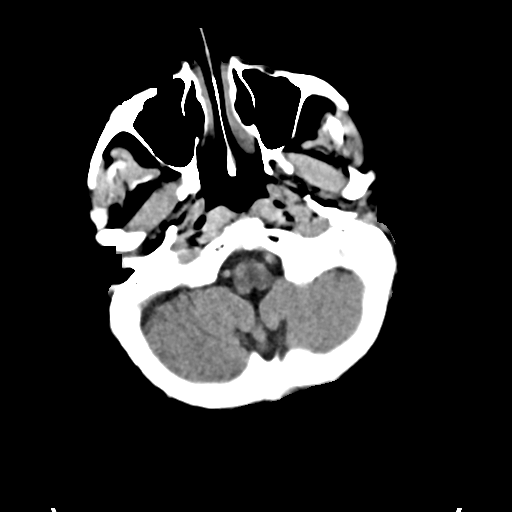
[im 5/33  bone]
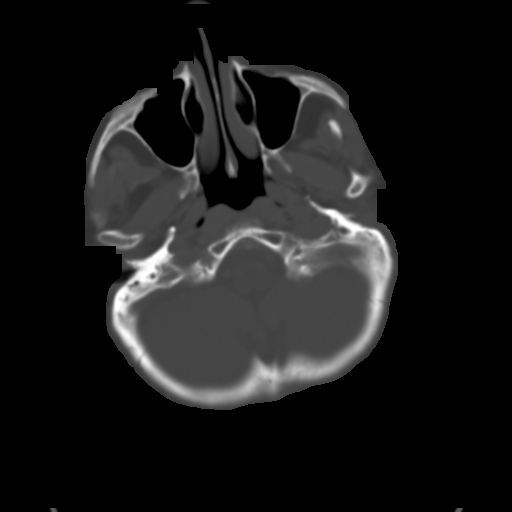
[im 9/33  brain]
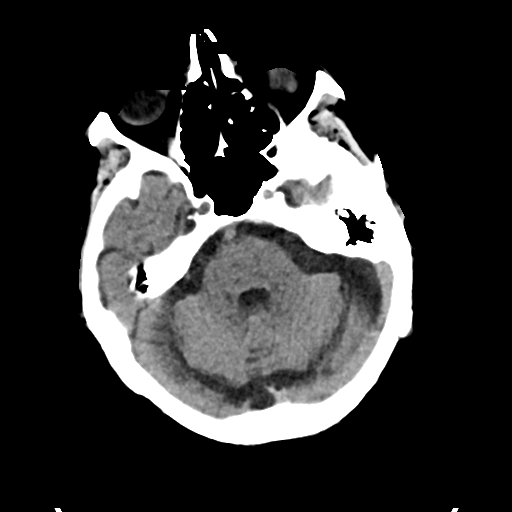
[im 13/33  brain]
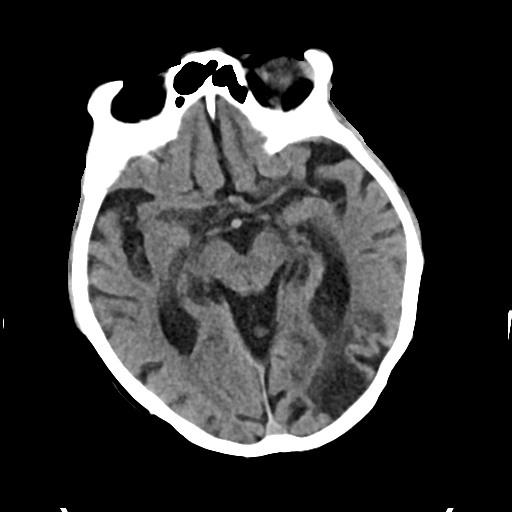
[im 17/33  brain]
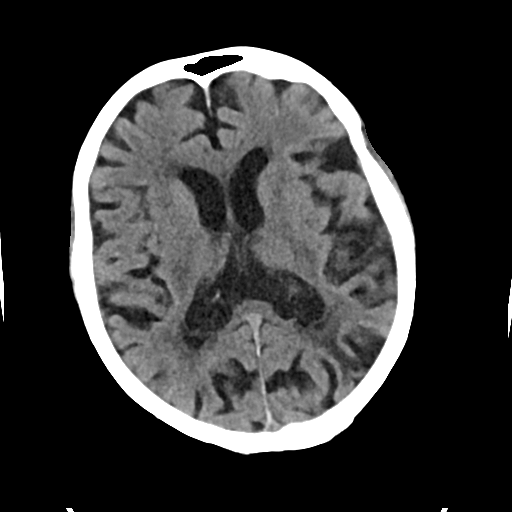
[im 21/33  brain]
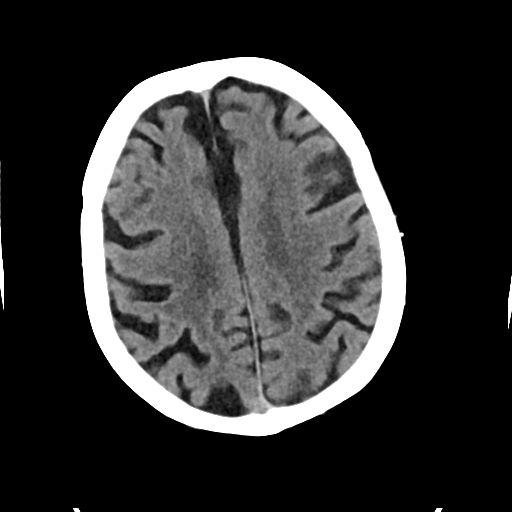
[im 21/33  bone]
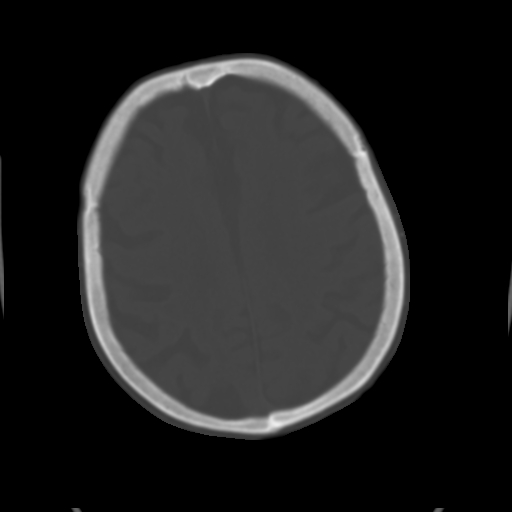
[im 25/33  brain]
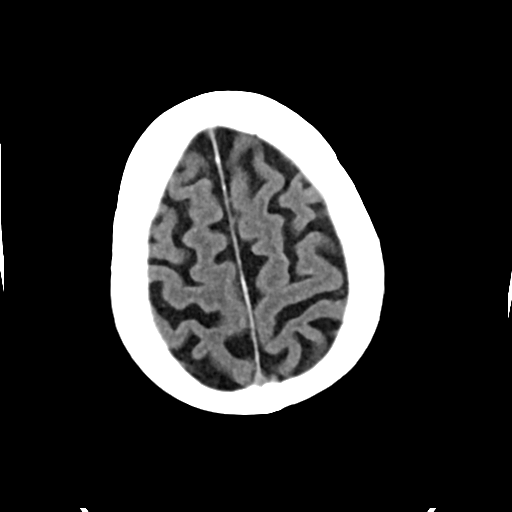
[im 29/33  brain]
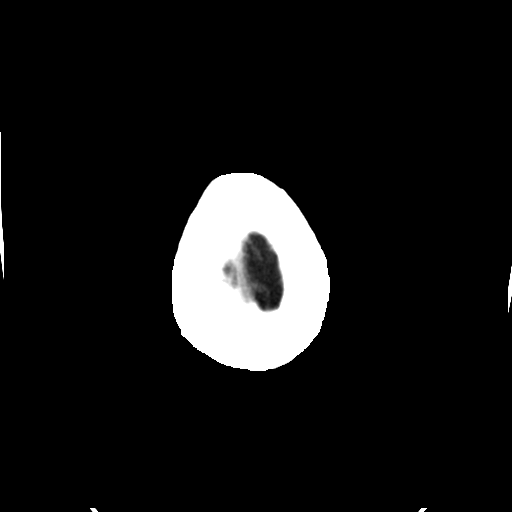

[Series 4: head bone · axial · 0.43mm/px · z∈[-164,-148]mm · 2 of 82 slices shown]
[im 9/82  bone]
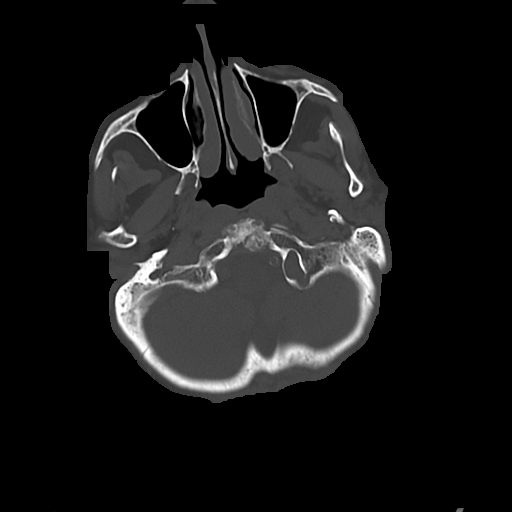
[im 17/82  bone]
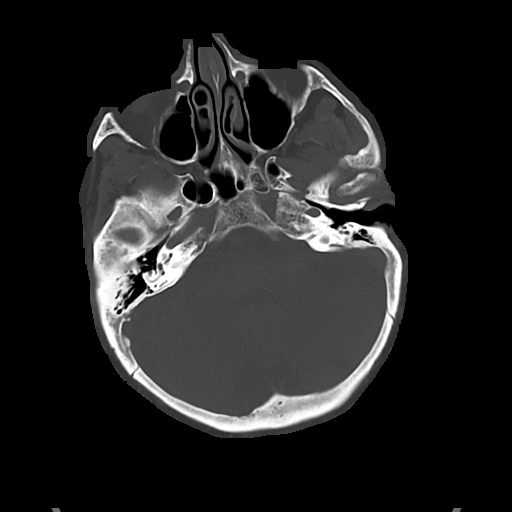

[Series 5: cor soft · coronal · 0.31mm/px · 3 of 70 slices shown]
[im 24/70  brain]
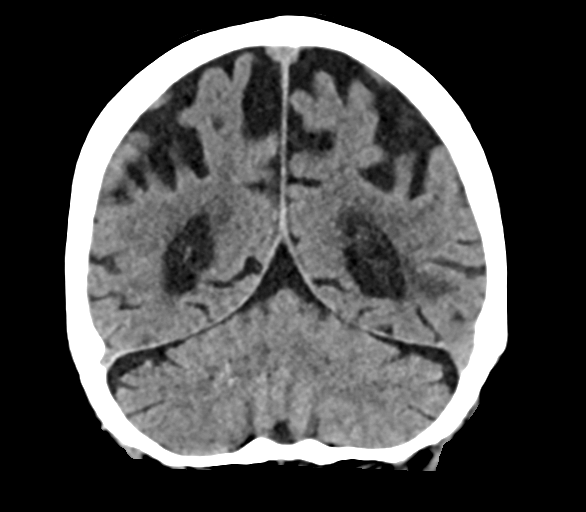
[im 31/70  brain]
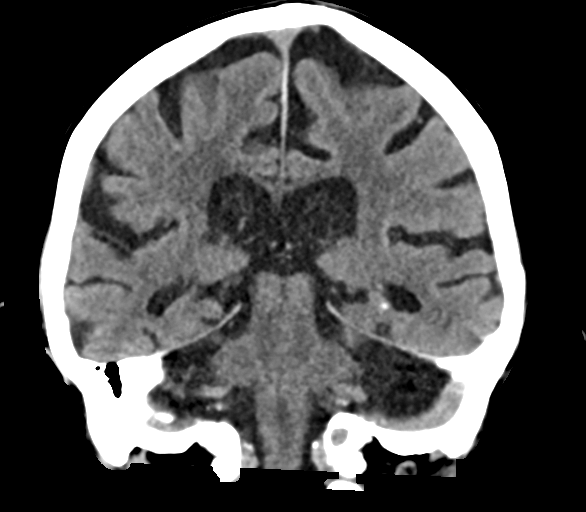
[im 39/70  brain]
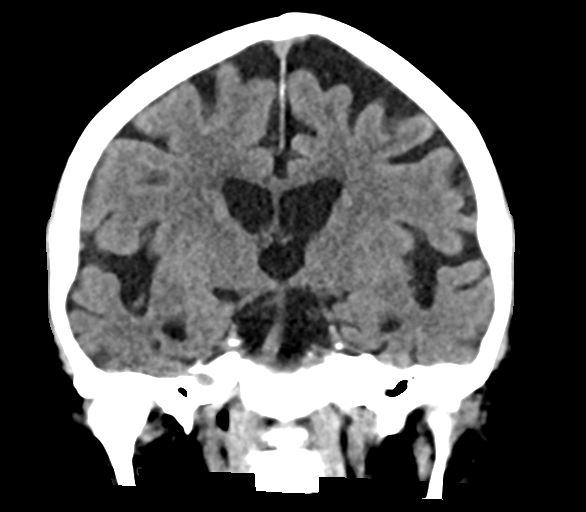

[Series 6: sag soft · sagittal · 0.31mm/px · 3 of 61 slices shown]
[im 21/61  brain]
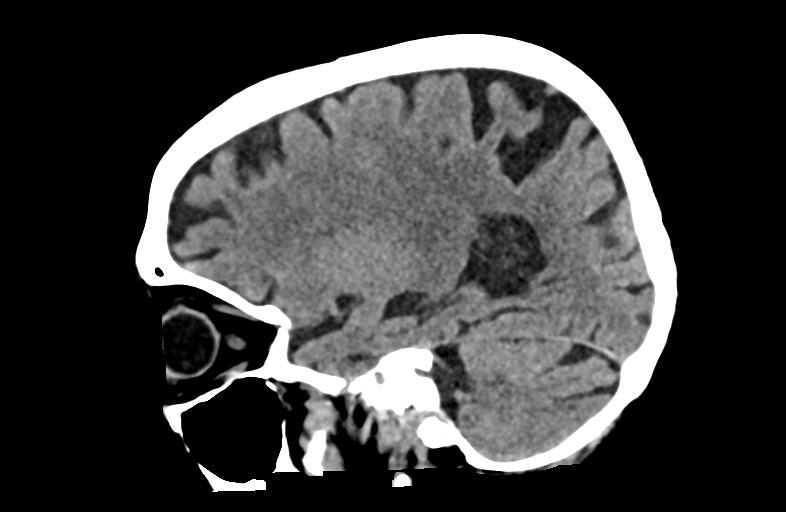
[im 31/61  brain]
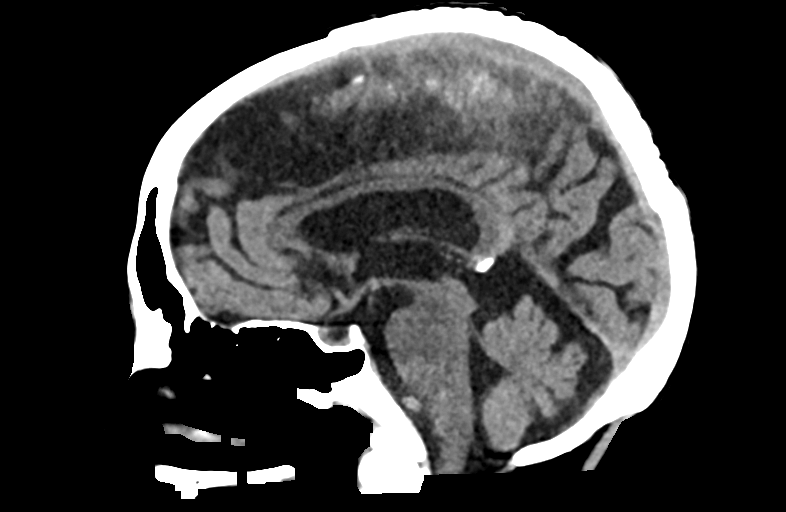
[im 41/61  brain]
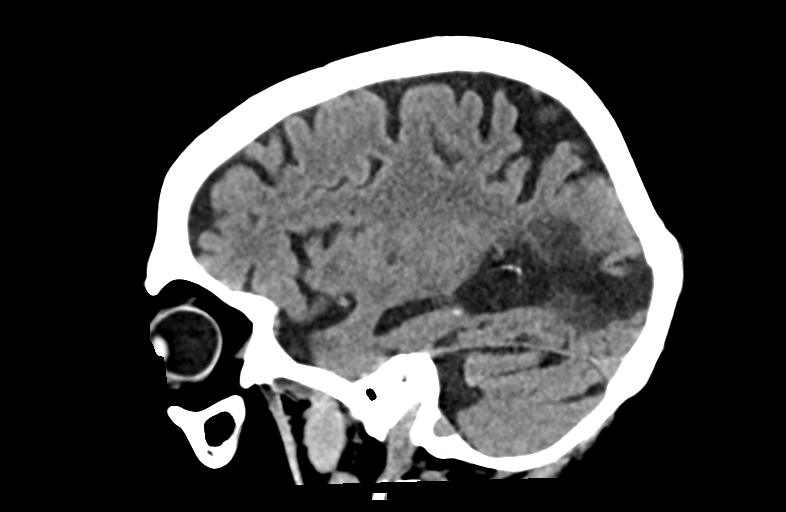

[15 of 47 positions shown; findings below may reference images not displayed]

FINDINGS: Brain: Stable cerebral volume. Chronic encephalomalacia in the
lateral left occipital lobe. Small chronic infarct in the right
cerebellum. Mild heterogeneity in the left basal ganglia and in the
pons appears stable. Stable gray-white matter differentiation
throughout the brain.

No midline shift, ventriculomegaly, mass effect, evidence of mass
lesion, intracranial hemorrhage or evidence of cortically based
acute infarction.

Vascular: Calcified atherosclerosis at the skull base. No suspicious
intracranial vascular hyperdensity.

Skull: Chronic skull base and upper cervical spine degeneration. No
acute osseous abnormality identified.

Sinuses/Orbits: Visualized paranasal sinuses and mastoids are stable
and well pneumatized.

Other: No scalp hematoma identified. Stable orbits.
IMPRESSION: 1. No acute intracranial abnormality or acute traumatic injury
identified.
2. Chronic ischemic disease most pronounced in the left PCA and
right cerebellar artery territories.

## 2021-09-27 IMAGING — CR DG CHEST 1V
1 series · 1 of 1 positions shown · non-contrast
Comparison: 06/10/2019

CLINICAL DATA: Fell while walking in hallway, unwitnessed; history
of ovarian and breast cancer

EXAM:
CHEST  1 VIEW

[chest ap]
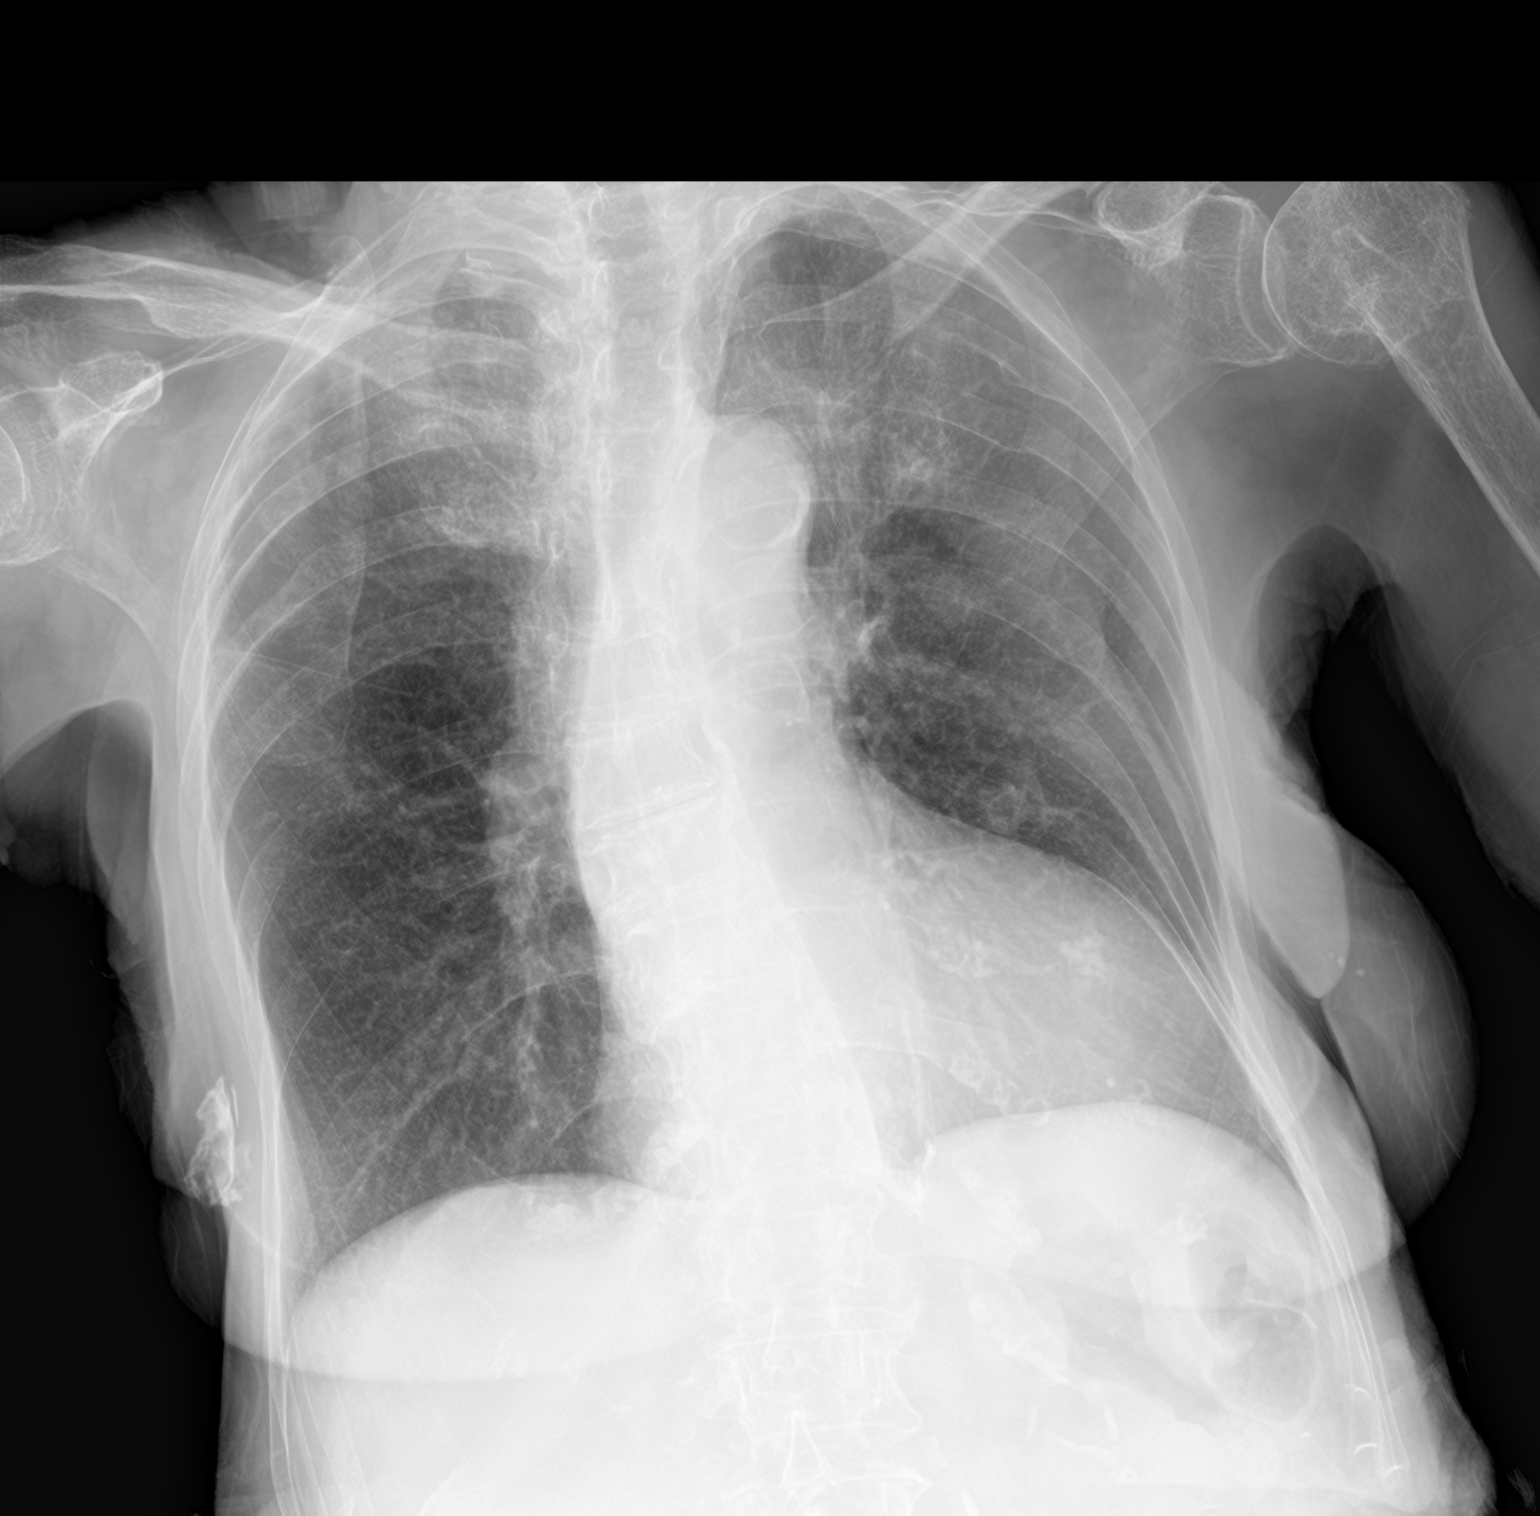

[1 of 1 positions shown; findings below may reference images not displayed]

FINDINGS: Enlargement of cardiac silhouette.

Mediastinal contours and pulmonary vascularity normal.

Atherosclerotic calcification aorta.

Emphysematous and bronchitic changes consistent with COPD.

No acute infiltrate, pleural effusion or pneumothorax.

Osseous demineralization with biconvex thoracolumbar scoliosis and
multiple old LEFT rib fractures.
IMPRESSION: Enlargement of cardiac silhouette.

COPD changes without acute abnormality.
